# Patient Record
Sex: Female | Born: 1945 | Race: White | Hispanic: No | Smoking: Never smoker
Health system: Southern US, Community
[De-identification: ages and names within clinical notes are randomized; demographics above are authoritative.]

## PROBLEM LIST (undated history)

## (undated) DIAGNOSIS — J45909 Unspecified asthma, uncomplicated: Secondary | ICD-10-CM

## (undated) DIAGNOSIS — R1310 Dysphagia, unspecified: Secondary | ICD-10-CM

## (undated) DIAGNOSIS — M1990 Unspecified osteoarthritis, unspecified site: Secondary | ICD-10-CM

## (undated) DIAGNOSIS — J40 Bronchitis, not specified as acute or chronic: Secondary | ICD-10-CM

## (undated) DIAGNOSIS — H35379 Puckering of macula, unspecified eye: Secondary | ICD-10-CM

## (undated) DIAGNOSIS — I2510 Atherosclerotic heart disease of native coronary artery without angina pectoris: Secondary | ICD-10-CM

## (undated) DIAGNOSIS — N12 Tubulo-interstitial nephritis, not specified as acute or chronic: Secondary | ICD-10-CM

## (undated) DIAGNOSIS — I249 Acute ischemic heart disease, unspecified: Secondary | ICD-10-CM

## (undated) DIAGNOSIS — H409 Unspecified glaucoma: Secondary | ICD-10-CM

## (undated) DIAGNOSIS — M353 Polymyalgia rheumatica: Secondary | ICD-10-CM

## (undated) DIAGNOSIS — A0472 Enterocolitis due to Clostridium difficile, not specified as recurrent: Secondary | ICD-10-CM

## (undated) DIAGNOSIS — M316 Other giant cell arteritis: Secondary | ICD-10-CM

## (undated) DIAGNOSIS — G43909 Migraine, unspecified, not intractable, without status migrainosus: Secondary | ICD-10-CM

## (undated) HISTORY — PX: FINGER SURGERY: SHX640

## (undated) HISTORY — PX: FOOT SURGERY: SHX648

## (undated) HISTORY — PX: CHOLECYSTECTOMY: SHX55

## (undated) HISTORY — PX: APPENDECTOMY: SHX54

## (undated) HISTORY — PX: SKIN BIOPSY: SHX1

## (undated) HISTORY — PX: KNEE SURGERY: SHX244

## (undated) HISTORY — PX: EYE SURGERY: SHX253

---

## 1993-12-07 HISTORY — PX: CORONARY STENT PLACEMENT: SHX1402

## 2008-09-04 ENCOUNTER — Ambulatory Visit: Payer: Self-pay | Admitting: Cardiology

## 2008-09-05 ENCOUNTER — Inpatient Hospital Stay (HOSPITAL_COMMUNITY): Admission: AD | Admit: 2008-09-05 | Discharge: 2008-09-07 | Payer: Self-pay | Admitting: Cardiology

## 2008-09-05 ENCOUNTER — Ambulatory Visit: Payer: Self-pay | Admitting: Cardiovascular Disease

## 2008-09-13 ENCOUNTER — Inpatient Hospital Stay (HOSPITAL_COMMUNITY): Admission: EM | Admit: 2008-09-13 | Discharge: 2008-09-16 | Payer: Self-pay | Admitting: Emergency Medicine

## 2008-09-13 ENCOUNTER — Ambulatory Visit: Payer: Self-pay | Admitting: Cardiology

## 2008-09-14 ENCOUNTER — Encounter: Payer: Self-pay | Admitting: Cardiovascular Disease

## 2008-10-02 ENCOUNTER — Ambulatory Visit: Payer: Self-pay | Admitting: Cardiovascular Disease

## 2009-08-17 DIAGNOSIS — I10 Essential (primary) hypertension: Secondary | ICD-10-CM | POA: Insufficient documentation

## 2009-08-17 DIAGNOSIS — M1990 Unspecified osteoarthritis, unspecified site: Secondary | ICD-10-CM | POA: Insufficient documentation

## 2009-08-17 DIAGNOSIS — E782 Mixed hyperlipidemia: Secondary | ICD-10-CM

## 2009-08-17 DIAGNOSIS — J45909 Unspecified asthma, uncomplicated: Secondary | ICD-10-CM | POA: Insufficient documentation

## 2009-08-17 DIAGNOSIS — E119 Type 2 diabetes mellitus without complications: Secondary | ICD-10-CM

## 2010-08-07 HISTORY — PX: COLONOSCOPY: SHX174

## 2011-04-21 NOTE — Discharge Summary (Signed)
NAME:  Kelli Stewart, Kelli Stewart               ACCOUNT NO.:  [id number]   MEDICAL RECORD NO.:  [id number]          PATIENT TYPE:  INP   LOCATION:  4730                         FACILITY:  [facility]   PHYSICIAN:  Madolyn Frieze. Jens Som, MD, FACCDATE OF BIRTH:  01-27-1946   DATE OF ADMISSION:  09/13/2008  DATE OF DISCHARGE:  09/16/2008                               DISCHARGE SUMMARY   PROCEDURES:  1. VQ scan.  2. Two-view chest x-ray.  3. Two-D echocardiogram.   PRIMARY AND FINAL DISCHARGE DIAGNOSIS:  Acute coronary syndrome, medical  therapy for coronary artery disease recommended.   SECONDARY DIAGNOSES:  1. Coronary artery disease status post cardiac catheterization September 06, 2008 showing minimal in-stent restenosis of 20% in the left      anterior descending, obtuse marginal-2 20%, right coronary artery      40%, ejection fraction 65%.  2. Preserved left ventricular function with an ejection fraction of 65-      70% and no wall motion abnormalities, diastolic dysfunction seen      with no significant valvular abnormalities and a small pericardial      effusion, 3 mm anterior, and 3 mm posterior by echocardiogram      September 14, 2008.  3. Diabetes.  4. Status post drug-eluting stent to the left anterior descending in      2005 in [place], [place].  5. Hypertension.  6. Hyperlipidemia.  7. Osteoarthritis.  8. History of pulmonary embolus in 1981.  9. History of noncompliance with medications secondary to financial      issues.  10.History of asthma.  11.Peripheral vascular disease with supportive carotid disease.  12.Status post cholecystectomy and appendectomy.  13.Allergy or intolerance to IV CONTRAST.  AMBIEN, CELEBREX,      HYDROCHLOROTHIAZIDE, LASIX, MAXZIDE, MOTRIN, THEO-DUR, ENALAPRIL.   TIME OF DISCHARGE:  41 minutes.   HOSPITAL COURSE:  Kelli Stewart is a 65 year old female that came to the  hospital with chest pain and had some elevation in the troponin to 0.16.  She was cathed  and had nonobstructive disease.  She was discharged on  September 07, 2008.  On September 13, 2008 she had recurrent chest pain and  came back to the hospital where she was admitted for further evaluation  and treatment.   Her cardiac enzymes had some elevation with a peak CK-MB 63/4.8 and a  peak troponin I 0.34.  It was felt that the elevations in her enzymes  were possibly cardiac but with a recent catheterization showing  nonobstructive disease.  A repeat catheterization was not indicated.  There was concern for small vessel disease and concern for a PE so a VQ  scan was performed.  The VQ scan showed no pulmonary embolus.  Medical  therapy for coronary artery disease was then recommended and her Imdur  was increased and her beta blocker was increased as well.  She tolerated  both of these changes.  A hemoglobin A1c was 6.9.  A lipid profile  showed total cholesterol 179, triglycerides 198, HDL 46, LDL 93.  She is  to continue on the  Pravachol she was taking prior to admission.  Of  note, she had some orthostatic dizziness and her blood pressure was  slightly low.  She was hydrated and her symptoms improved.   By September 16, 2008, Kelli Stewart was ambulating without chest pain or  shortness of breath.  Her dizziness had improved.  She was evaluated by  Dr. Jens Som and considered stable for discharge with close outpatient  followup.   DISCHARGE INSTRUCTIONS:  1. Her activity level is to be increased gradually.  2. She is not to do any lifting for another week.  3. She is to stick to a low-fat diabetic diet.  4. She is to follow up with Dr. Clifton James in approximately 2 weeks in      our office will call.  5. She is to keep her appointment with Dr. Toni Amend in [place] this      Friday.   DISCHARGE MEDICATIONS:  1. Aspirin 325 mg daily.  2. Advair 250/50 b.i.d.  3. Neurontin 400 mg t.id.  4. Glucotrol 5 mg daily.  5. Sliding scale insulin p.r.n.  6. Combivent 2 puffs four times a day.   7. Imdur 60 mg daily.  8. Metoprolol 25 mg b.i.d.  9. Pravachol 40 mg daily.  10.Sublingual nitroglycerin p.r.n.  11.Ultram 50 mg t.i.d. p.r.n.      Theodore Demark, PA-C      Madolyn Frieze. Jens Som, MD, [facility]  Electronically Signed    RB/MEDQ  D:  09/16/2008  T:  09/16/2008  Job:  [id number]   cc:   Julio Alm, MD

## 2011-04-21 NOTE — Assessment & Plan Note (Signed)
[facility]                            CARDIOLOGY OFFICE NOTE   NAME:Juba, MEAGEN LIMONES                      MRN:          [id number]  DATE:10/02/2008                            DOB:          [birth year]    PRIMARY CARE PHYSICIAN:  Doreen Beam   HISTORY OF PRESENT ILLNESS:  Ms. Wrightson is a pleasant 65 year old  Caucasian female with a past medical history significant for  hypertension, hyperlipidemia, diabetes mellitus, coronary artery  disease, and peripheral vascular disease who presents today to establish  care following a recent discharge from the [facility].  The  patient was initially admitted to [facility] on September 05, 2008, with complaints of chest pain.  Her troponins were mildly elevated  to 0.16.  Diagnostic left heart catheterization was performed on September 06, 2008, and showed a patent stent in the LAD, as well as mild  nonobstructive disease throughout the other vessels.  Her left  ventricular function was noted to be normal at that time.  She was  discharged to home and was subsequently readmitted to the hospital on  September 13, 2008, with complaints of recurrent chest pain.  At that time,  a V/Q scan was performed and showed very low probability for pulmonary  embolism.  An echocardiogram was performed and showed an ejection  fraction of 65-70% with no wall motion abnormalities.  There was a small  pericardial effusion noted on the echocardiogram.  The patient once  again had a mild elevation of her troponin at 0.34.  It was not felt  that she had any significant obstructive coronary artery disease since  her catheterization had just been performed on September 06, 2008.  It was  felt that most likely she had pericarditis; however, she had no active  chest pain when she was discharged from the hospital.  The patient went  home and then was readmitted to [facility] on September 21, 2008,  after she had an  episode of chest discomfort while in the waiting room  in her primary care physician's office.  She was admitted and ruled out  for myocardial infarction.  She was then treated for her pericarditis  and was started on colchicine.  She tells me that she has had no  recurrence of her pain in the last 10 days.  She comes in today to  establish cardiology care.   The patient tells me today that she has had no recurrence of her chest  pain since her discharge from the hospital in [place] 10 days ago.  She  does tell me that she has had some weakness and fatigue seem to have a  total lack of energy.  She is to exercise 5 days per week, but has not  been exercising at all.  She also notes that she had several weeks of  diarrhea last month and contribute some of her weakness to starting  after this episode.  She denies any dizziness, near syncope, syncope,  orthopnea, PND, or lower extremity edema.   PAST MEDICAL HISTORY:  1. Hypertension.  2.  Hyperlipidemia.  3. Diabetes mellitus.  4. Coronary artery disease status post placement of a drug-eluting      stent in the left anterior descending coronary artery in 2005 with      followup catheterization done on September 06, 2008, showing mild      restenosis inside the stented segment.  5. History of pulmonary embolism in 1981.  6. Asthma.  7. Peripheral vascular disease with known carotid artery disease.  8. Osteoarthritis.   PAST SURGICAL HISTORY:  1. Cholecystectomy.  2. Appendectomy.  3. Finger surgery.  4. Bilateral knee scopes.   ALLERGIES:  AMBIEN, MAXZIDE, MOTRIN, THEO-DUR, ENALAPRIL, BETADINE,  HYDROCHLOROTHIAZIDE, LASIX, CELEBREX, NONSTEROIDAL ANTI-INFLAMMATORY  DRUGS.   CURRENT MEDICATIONS:  1. Aspirin 325 mg once daily.  2. Advair 250/50 mg twice daily.  3. Neurontin 400 mg three times daily.  4. Glucotrol 5 mg once daily.  5. Imdur 60 mg once daily.  6. Metoprolol 25 mg twice daily.  7. Pravachol 40 mg once daily.  8.  Metformin 500 mg twice daily.  9. Combivent 2 puffs twice daily.  10.Lamictal 25 mg twice daily.  11.Humulin regular insulin as directed.  12.Percocet b.i.d. for migraines.  13.Colchicine 0.6 mg twice daily.   SOCIAL HISTORY:  The patient does not use tobacco, alcohol, or illicit  drugs.  She was formerly employed as a Engineer, civil (consulting).   FAMILY HISTORY:  Positive for malignancy, heart disease, and  fibromyalgia.   REVIEW OF SYSTEMS:  As stated in the history of present illness and is  otherwise negative.   PHYSICAL EXAMINATION:  VITALS:  Blood pressure 104/71, pulse 80 and  regular, respirations 12 and nonlabored.  GENERAL:  She is an middle-aged Caucasian female in no acute distress.  She is alert and oriented x3.  PSYCHIATRIC:  Mood and affect are appropriate.  NEUROLOGIC:  No focal neurological deficits.  SKIN:  Warm and dry.  HEENT:  Oropharynx clear.  Mucous membranes are moist.  NECK:  No JVD.  No carotid bruits noted.  No thyromegaly.  No  lymphadenopathy.  LUNGS:  Clear to auscultation bilaterally without wheezes, rhonchi, or  crackles noted.  CARDIOVASCULAR:  Regular rate and rhythm without murmurs, gallops, or  rubs noted.  ABDOMEN:  Soft, nontender, nondistended.  Bowel sounds are present.  EXTREMITIES:  No evidence of edema.  Pulses are 2+ in the bilateral  dorsalis pedis and posterior tibial arteries.  Pulses are 2+ in the  bilateral radial arteries.  MUSCULOSKELETAL:  Strength and motor tone are normal.   DIAGNOSTIC STUDIES:  1. Surface echocardiogram performed on September 14, 2008, shows normal      left ventricular size with normal left ventricular function.      Ejection fraction estimated at 65-70%.  There was a small      pericardial effusion 3 mm x 3 mm in dimension.  There were no      significant valvular abnormalities noted.  2. Cardiac catheterization performed on September 06, 2008, showed no      evidence of left main coronary artery disease.  The stented  segment      in the proximal LAD showed minimal in-stent restenosis of 20%.      There was plaque disease noted in the mid LAD.  There were two      moderate-sized diagonal branches that were free of disease.  The      circumflex artery had plaque disease in the proximal portion.  The      second  obtuse marginal had a 20% ostial lesion.  There was a      moderate size ramus intermedius branch that was free of disease.      Right coronary artery was a small and nondominant artery that had a      40% proximal stenosis.  Ejection fraction was estimated at 65% by      left ventricular angiogram.  3. A 12-lead electrocardiogram obtained in our office today shows      normal sinus rhythm with a ventricular rate of 80 beats per minute      and no ischemic changes.   ASSESSMENT AND PLAN:  This is a pleasant 65 year old Caucasian female  with a past medical history of coronary artery disease with placement of  a PROMUS drug-eluting stent in her proximal left anterior descending  (coronary artery) in 2005, hypertension, hyperlipidemia, and diabetes  mellitus who has had several recent hospitalizations for chest pain that  are most likely secondary to pericarditis.  There was no evidence of  obstructive coronary artery disease in her most recent heart  catheterization on September 06, 2008.  I think that she is on appropriate  medical therapy currently.  I agree with continuing her on the  colchicine for treatment of her possible pericarditis.  I will also  continue her aspirin, beta-blocker, and statin therapy.  I would like to  see her back in this office in 6 months for followup.  I would like to  perform follow up carotid Doppler studies since she tells me today she  has had mild-to-moderate blockages noted in the carotid arteries in the  past.     Verne Carrow, MD  Electronically Signed    CM/MedQ  DD: 10/02/2008  DT: 10/02/2008  Job #: [id number]   cc:   Doreen Beam

## 2011-04-21 NOTE — H&P (Signed)
Kelli Stewart, Kelli Stewart               ACCOUNT NO.:  [id number]   MEDICAL RECORD NO.:  [id number]          PATIENT TYPE:  INP   LOCATION:  2916                         FACILITY:  [facility]   PHYSICIAN:  Rollene Rotunda, MD, FACCDATE OF BIRTH:  August 17, 1946   DATE OF ADMISSION:  09/13/2008  DATE OF DISCHARGE:                              HISTORY & PHYSICAL   PRIMARY CARE Nikisha Fleece:  Dr. Doreen Beam in [place].   PATIENT'S PROFILE:  A 65 year old Caucasian female with history of CAD,  status post recent catheterization revealing nonobstructive disease  presents with recurrent chest pain and dyspnea.   PROBLEMS:  1. Chest pain and dyspnea.  2. CAD.      a.     Status post drug-eluting stent to the LAD in 2005 in       [place], [place].      b.     September 06, 2008, cardiac catheterization left main normal.       LAD 20% in-stent restenosis.  Left circumflex minimal proximal       plaque.  OM-2 20%.  Ramus intermedius normal.  RCA 40% proximal.       EF 65%.  3. Hypertension x2 years.  4. Hyperlipidemia x4 years.  5. Diabetes mellitus x5 years.  6. Osteoarthritis.  7. History of noncompliance.  8. History of pulmonary embolism in 1981.  9. Asthma.  10.PVD with reported carotid disease.  11.Status post cholecystectomy and appendectomy.   HISTORY OF PRESENT ILLNESS:  A 65 year old Caucasian female with history  of CAD status post drug-eluting stent to the LAD in 2005.  She was  recently admitted to [facility] secondary to chest pain and dyspnea with  catheterization on September 06, 2008, showing patent LAD stent.  Otherwise, nonobstructive disease and normal LV function.  Following  discharge, she did well, but continued no dyspnea on exertion.  Associated brief spell of left-sided chest discomfort lasting about 15  seconds and resolving spontaneously.  Today while showering, she had  sudden onset of dizziness with chest pain and shortness of breath times  approximately 15 minutes resolving  with rest.  Her chest discomfort and  dyspnea recurred 3 additional times today, and she asked her daughter to  drive her from her home in [place] to [facility] for evaluation.  Currently, she is pain free, but is tachycardic with rates up to the  120s.  ECG, otherwise, shows no acute changes.  She has mild elevation  of troponin at 0.08.   ALLERGIES:  IV CONTRAST, AMBIEN, CELEBREX, HYDROCHLOROTHIAZIDE, LASIX,  MAXZIDE, MOTRIN, THEO-DUR, AND ENALAPRIL.   HOME MEDICATIONS:  1. Advair Diskus 1 puff b.i.d.  2. Aspirin 325 mg daily.  3. Combivent inhaler q.i.d.  4. Glipizide 5 mg daily.  5. Lopressor 12.5 mg b.i.d.  6. Metformin 500 mg b.i.d.  7. Neurontin 400 mg t.i.d.  8. Pravachol 40 mg daily.  9. Imdur 30 mg daily.   FAMILY HISTORY:  Mother died of renal failure at age 29 following IV  contrast.  Father died of COPD at 64.  She had a brother who died at 3  of lung cancer, and a sister who is alive at age 46 with fibromyalgia  and hyperlipidemia.   SOCIAL HISTORY:  She lives in [place], [place] with her daughter.  She  is disabled.  She denies tobacco, alcohol, or drug use.  She is not  routinely exercising.   REVIEW OF SYSTEMS:  Positive for chest pain and dyspnea as well as  dizziness, outlined in the HPI.  Otherwise all systems reviewed and  negative.   PHYSICAL EXAMINATION:  VITAL SIGNS:  Temperature 98.1, heart rate 123,  respirations 22, blood pressure 149/84, pulse ox 95% on room air.  GENERAL:  Pleasant [place] female in no acute distress.  Awake and  oriented x3.  HEENT:  Normal.  Nares grossly intact.  Nonfocal.  SKIN:  Warm and dry without lesions or masses.  NECK:  No bruits or JVD.  LUNGS:  Respirations regular, unlabored, clear to auscultation.  CARDIAC:  Regular, tachycardic, S1 and S2.  No S3, S4, or murmurs.  ABDOMEN:  Round, soft, nontender, and nondistended.  Bowel sounds  present.  EXTREMITIES:  Warm, dry, pink.  No clubbing, cyanosis, or edema.   Dorsalis pedis, posterior tibial pulses 2+ bilaterally.   Chest x-ray shows mild central venous pulmonary congestion.  EKG shows  sinus tach at a rate of 122.  Normal axis.  No acute ST-T changes.  Hemoglobin 14.2, hematocrit 41.7, WBC 8.3, platelets 193.  Glucose 238,  CK-MB 2.8, troponin I 0.08.   ASSESSMENT AND PLAN:  1. Chest pain and dyspnea, status post recent catheterization on      September 06, 2008, showing nonobstructive disease.  She has continued      to have dyspnea and now has had recurrent chest pain.  She is      tachycardic, but otherwise stable.  Clinical presentation      concerning for pulmonary embolism.  We will obtain a V/Q scan      tonight (V/Q instead of the CT because of dye allergy) and we will      also initiate Lovenox.  Troponin I is mildly elevated at 0.08.  We      will continue to cycle cardiac markers.  Doubt cardiac etiology in      light of recent catheterization showing nonobstructive disease.      Continue aspirin, statin, beta-blocker, and nitrate.  2. Hypertension.  Blood pressure is up currently.  We will follow and      adjustment if necessary.  3. Hyperlipidemia.  Continue statin therapy.  4. Diabetes mellitus.  I will hold her metformin in case she does      require dye at some point during this admission.  We will add      sliding scale insulin.  5. Asthma.  She is not currently wheezing.  Continue Advair and      Combivent.      Nicolasa Ducking, ANP      Rollene Rotunda, MD, [facility]  Electronically Signed    CB/MEDQ  D:  09/13/2008  T:  09/14/2008  Job:  [id number]

## 2011-04-21 NOTE — Cardiovascular Report (Signed)
NAME:  Kelli Stewart, Kelli Stewart               ACCOUNT NO.:  [id number]   MEDICAL RECORD NO.:  [id number]          PATIENT TYPE:  INP   LOCATION:  3705                         FACILITY:  [facility]   PHYSICIAN:  Verne Carrow, MDDATE OF BIRTH:  09-09-1946   DATE OF PROCEDURE:  09/06/2008  DATE OF DISCHARGE:                            CARDIAC CATHETERIZATION   INDICATIONS:  Chest pain in a patient with known coronary artery disease  as well as hypertension, hyperlipidemia, diabetes mellitus, who has not  been taking her medication secondary to financial issues.   OPERATOR:  Verne Carrow, MD   PROCEDURES PERFORMED:  1. Left heart catheterization  2. Selective coronary angiography.  3. Left ventricular angiogram.  4. Placement of an Angio-Seal femoral artery closure device.   PROCEDURE IN DETAIL:  The patient was brought to the Heart  Catheterization Laboratory after signing informed consent for the  procedure.  The right groin was prepped and draped in sterile fashion.  A 6-French sheath was inserted into the right femoral artery.  The left  coronary system was injected with a standard JL-4 diagnostic catheter.  I was unable to engage the JR-4 into the right coronary artery.  A no  torque right catheter was used to selectively inject the small  nondominant right coronary artery.  A 6-French pigtail catheter was then  used to cross the aortic valve into the left ventricle.  The left  ventricular angiogram was performed at this time.  At this point, the  pigtail catheter was pulled back across the aortic valve with no  significant pressure gradient measured.  An Angio-Seal femoral artery  closure device was placed in the right femoral artery for hemostasis.  The patient tolerated the procedure well and was taken to the holding  area in stable condition.   ANGIOGRAPHIC FINDINGS:  1. The left main coronary artery bifurcated into the circumflex and      the LAD.  There was no disease  noted.  2. The left anterior descending artery was a large vessel that goes to      the apex.  The stented segment of the proximal LAD shows minimal in-      stent restenosis of approximately 20%.  There is plaque disease      noted in the mid LAD.  There are 2 moderate-sized diagonal branches      that are free of disease.  3. Circumflex artery has plaque noted in the proximal portion.  There      is a small first obtuse marginal and a larger second obtuse      marginal.  There is 20% ostial lesion in the second obtuse      marginal.  There is a moderate size ramus intermedius branch that      is free of disease.  4. The right coronary artery is a small nondominant artery that has a      40% proximal stenosis.  5. Left ventricular angiogram demonstrated normal systolic function      with no wall motion abnormalities.  The ejection fraction was      estimated at  65%.   HEMODYNAMIC FINDINGS:  Left ventricular pressure 112/6, end-diastolic  pressure 9, central aortic pressure 112/60.   IMPRESSION:  1. Stable single-vessel coronary artery disease.  2. Normal systolic function.  3. Medical noncompliance secondary to financial reasons.   RECOMMENDATIONS:  I will continue medical management, which will include  an aspirin, a statin, and a beta blocker in this patient.  I will also  add Imdur 30 mg once daily.  The patient should be restarted on all of  her diabetic medications as well.  I do not see any indication at this  time for the patient to be restarted on Plavix, as she has stated that  she cannot afford this medication.  Her stent was placed in the LAD 4  years ago.      Verne Carrow, MD  Electronically Signed     CM/MEDQ  D:  09/06/2008  T:  09/06/2008  Job:  [id number]

## 2011-04-21 NOTE — Discharge Summary (Signed)
NAMEJARITA, Kelli Stewart               ACCOUNT NO.:  [id number]   MEDICAL RECORD NO.:  [id number]          PATIENT TYPE:  INP   LOCATION:  3705                         FACILITY:  [facility]   PHYSICIAN:  Verne Carrow, MDDATE OF BIRTH:  January 22, 1946   DATE OF ADMISSION:  09/05/2008  DATE OF DISCHARGE:  09/07/2008                               DISCHARGE SUMMARY   PRIMARY CARDIOLOGIST:  Verne Carrow, MD.   PRIMARY CARE PHYSICIAN:  Family practice, Dr. Toni Amend in [place].   PROCEDURES PERFORMED DURING HOSPITALIZATION:  1. Cardiac catheterization completed by Dr. Verne Carrow.      a.     Stable single-vessel coronary artery disease with normal       systolic function.   FINAL DISCHARGE DIAGNOSES:  1. Coronary artery disease.      a.     Status post drug-eluting stent to the left anterior       descending completed in 2005, in [place].  2. Diabetes.  3. Hypertension.  4. Hypercholesterolemia.  5. Arthritis.  6. Medical noncompliance secondary to cost issues.   HOSPITAL COURSE:  This is a 65 year old Caucasian female who was  transferred from [facility] secondary to recurrent chest  discomfort.  The patient does have an allergy to contrast dye and was  admitted for cardiac catheterization secondary to unstable angina.  The  patient's troponins were found be positive at 0.16, 0.06, and 0.02  respectively.  As a result of this, the patient was admitted and  underwent cardiac catheterization per Dr. Verne Carrow on  September 05, 2008, with results discussed above.  Please see Dr.  Gibson Ramp thorough cardiac catheterization note for more details.  The  patient admitted to medical noncompliance secondary to financial  reasons.  She was seen by case manager and was told that she did have  insurance and would not be eligible for any drug assistance program.  We  did change her medications to allow her to receive her medications from  [facility] four dollar  list.  Of note, the patient had been on Plavix in  the past and this was discontinued and was started on Imdur and aspirin.  The patient is without complaints of chest discomfort.  She did have  some hallucinations on Ambien overnight.  Otherwise, she had no further  chest pain or discomfort during hospitalization.  The patient was  anxious to return home after being seen by Dr. Clifton James this a.m. and  the patient was informed on medication changes and drug list from [facility], which we are providing for her.   DISCHARGE LABORATORY DATA:  Sodium 143, potassium 3.8, chloride 113, CO2  of 24, BUN 12, creatinine 0.78, and glucose 98.  Hemoglobin 13.5,  hematocrit 39.4, [place] blood cells 10.0, platelets 159, and hemoglobin  A1c 6.6.  Blood pressure 111/72, heart rate 90, respirations 80,  temperature 97.0, and O2 sat 93% on room air.   DISCHARGE MEDICATIONS:  1. Pravachol 40 mg daily.  2. Isosorbide 30 mg daily.  3. Lopressor 25 mg 1/2 tablet twice a day.  4. Aspirin 325 mg daily.  5.  Glucophage 500 mg twice a day to start on September 08, 2008.  6. Glipizide 5 mg daily.  7. Gabapentin 400 mg 3 times a day.   ALLERGIES:  ENALAPRIL, FUROSEMIDE, MAXZIDE, CELEBREX and PREDNISONE.   FOLLOW UP PLANS AND APPOINTMENT:  1. The patient is to follow up with Dr. Verne Carrow on      December 04, 2008, at 2:00 p.m.  2. The patient is to follow with primary care physician for continued      medical management of diabetes.  3. The patient has been given post cardiac catheterization      instructions with particular emphasis on the right groin site for      evidence of bleeding, hematoma, or signs of infection.  Time spent      with the patient to include physician time 40 minutes.      Bettey Mare. Lyman Bishop, NP      Verne Carrow, MD  Electronically Signed    KML/MEDQ  D:  09/07/2008  T:  09/07/2008  Job:  [id number]   cc:   Doreen Beam

## 2011-09-07 LAB — GLUCOSE, CAPILLARY
Glucose-Capillary: 107 — ABNORMAL HIGH
Glucose-Capillary: 149 — ABNORMAL HIGH

## 2011-09-07 LAB — HEPARIN LEVEL (UNFRACTIONATED): Heparin Unfractionated: >2 — ABNORMAL HIGH

## 2011-09-08 LAB — GLUCOSE, CAPILLARY
Glucose-Capillary: 126 — ABNORMAL HIGH
Glucose-Capillary: 133 — ABNORMAL HIGH
Glucose-Capillary: 150 — ABNORMAL HIGH
Glucose-Capillary: 157 — ABNORMAL HIGH
Glucose-Capillary: 189 — ABNORMAL HIGH
Glucose-Capillary: 256 — ABNORMAL HIGH
Glucose-Capillary: 261 — ABNORMAL HIGH
Glucose-Capillary: 94

## 2011-09-08 LAB — BASIC METABOLIC PANEL
BUN: 11
CO2: 24
CO2: 29
Calcium: 9.2
Chloride: 113 — ABNORMAL HIGH
Creatinine, Ser: 0.78
GFR calc Af Amer: >60
GFR calc Af Amer: >60
GFR calc non Af Amer: >60
GFR calc non Af Amer: >60
GFR calc non Af Amer: >60
Potassium: 3.8
Sodium: 140
Sodium: 140
Sodium: 143

## 2011-09-08 LAB — DIFFERENTIAL
Eosinophils Absolute: 0.2
Lymphs Abs: 2.6
Monocytes Absolute: 0.7
Neutro Abs: 4.6
Neutrophils Relative %: 56

## 2011-09-08 LAB — COMPREHENSIVE METABOLIC PANEL
Alkaline Phosphatase: 57
BUN: 11
BUN: 11
CO2: 25
Calcium: 9.5
Creatinine, Ser: 0.59
Creatinine, Ser: 0.66
GFR calc Af Amer: >60
GFR calc non Af Amer: >60
Glucose, Bld: 96
Sodium: 142
Total Bilirubin: 0.5
Total Protein: 6.2

## 2011-09-08 LAB — CBC
HCT: 39.4
HCT: 42.5
Hemoglobin: 13.1
Hemoglobin: 13.5
Hemoglobin: 14.3
MCHC: 33.7
MCHC: 34.2
MCHC: 34.4
MCV: 92.4
MCV: 93
MCV: 93.6
MCV: 94.4
Platelets: 168
Platelets: 193
RBC: 3.84 — ABNORMAL LOW
RBC: 4.14
RDW: 12.8
RDW: 12.9
WBC: 10
WBC: 6.8

## 2011-09-08 LAB — POCT CARDIAC MARKERS
Myoglobin, poc: 64.1
Troponin i, poc: 0.08 — ABNORMAL HIGH

## 2011-09-08 LAB — PROTIME-INR
INR: 0.9
INR: 1.1
Prothrombin Time: 12.6
Prothrombin Time: 12.8
Prothrombin Time: 14

## 2011-09-08 LAB — HEPARIN LEVEL (UNFRACTIONATED): Heparin Unfractionated: 1.36 — ABNORMAL HIGH

## 2011-09-08 LAB — CARDIAC PANEL(CRET KIN+CKTOT+MB+TROPI)
CK, MB: 4.6 — ABNORMAL HIGH
CK, MB: 4.8 — ABNORMAL HIGH
Total CK: 50

## 2011-09-08 LAB — TSH: TSH: 3.531

## 2011-09-08 LAB — CK TOTAL AND CKMB (NOT AT ARMC)
CK, MB: 2.3
Relative Index: INVALID
Total CK: 36
Total CK: 55

## 2011-09-08 LAB — D-DIMER, QUANTITATIVE: D-Dimer, Quant: 0.39

## 2011-09-08 LAB — APTT: aPTT: 25

## 2011-09-08 LAB — HEMOGLOBIN A1C: Hgb A1c MFr Bld: 6.9 — ABNORMAL HIGH

## 2011-09-08 LAB — LIPID PANEL
HDL: 46
Total CHOL/HDL Ratio: 3.9
Triglycerides: 198 — ABNORMAL HIGH

## 2012-05-24 ENCOUNTER — Emergency Department (HOSPITAL_COMMUNITY)
Admission: EM | Admit: 2012-05-24 | Discharge: 2012-05-24 | Disposition: A | Discharge status: Home / Self Care | Payer: Medicare Other | Attending: Emergency Medicine | Admitting: Emergency Medicine

## 2012-05-24 ENCOUNTER — Encounter (HOSPITAL_COMMUNITY): Payer: Self-pay

## 2012-05-24 DIAGNOSIS — L0390 Cellulitis, unspecified: Secondary | ICD-10-CM

## 2012-05-24 DIAGNOSIS — L03317 Cellulitis of buttock: Secondary | ICD-10-CM | POA: Insufficient documentation

## 2012-05-24 DIAGNOSIS — E119 Type 2 diabetes mellitus without complications: Secondary | ICD-10-CM | POA: Insufficient documentation

## 2012-05-24 DIAGNOSIS — L0231 Cutaneous abscess of buttock: Secondary | ICD-10-CM | POA: Insufficient documentation

## 2012-05-24 HISTORY — DX: Unspecified osteoarthritis, unspecified site: M19.90

## 2012-05-24 HISTORY — DX: Unspecified asthma, uncomplicated: J45.909

## 2012-05-24 HISTORY — DX: Other giant cell arteritis: M31.6

## 2012-05-24 HISTORY — DX: Migraine, unspecified, not intractable, without status migrainosus: G43.909

## 2012-05-24 HISTORY — DX: Tubulo-interstitial nephritis, not specified as acute or chronic: N12

## 2012-05-24 HISTORY — DX: Puckering of macula, unspecified eye: H35.379

## 2012-05-24 HISTORY — DX: Acute ischemic heart disease, unspecified: I24.9

## 2012-05-24 HISTORY — DX: Unspecified glaucoma: H40.9

## 2012-05-24 HISTORY — DX: Enterocolitis due to Clostridium difficile, not specified as recurrent: A04.72

## 2012-05-24 HISTORY — DX: Polymyalgia rheumatica: M35.3

## 2012-05-24 MED ORDER — METRONIDAZOLE 500 MG PO TABS: 500.0000 mg | ORAL_TABLET | Freq: Two times a day (BID) | ORAL | Status: DC

## 2012-05-24 MED ORDER — HYDROCODONE-ACETAMINOPHEN 5-325 MG PO TABS: 1.0000 | ORAL_TABLET | ORAL | Status: DC | PRN

## 2012-05-24 MED ORDER — DOXYCYCLINE HYCLATE 100 MG PO CAPS: 100.0000 mg | ORAL_CAPSULE | Freq: Two times a day (BID) | ORAL | Status: DC

## 2012-05-24 MED ORDER — LIDOCAINE HCL (PF) 1 % IJ SOLN
5.0000 mL | Freq: Once | INTRAMUSCULAR | Status: AC
  Administered 2012-05-24: 5 mL via INTRADERMAL
  Filled 2012-05-24: qty 5

## 2012-05-24 NOTE — Discharge Instructions (Signed)
Abscess An abscess (boil or furuncle) is an infected area that contains a collection of pus.  SYMPTOMS Signs and symptoms of an abscess include pain, tenderness, redness, or hardness. You may feel a moveable soft area under your skin. An abscess can occur anywhere in the body.  TREATMENT  A surgical cut (incision) may be made over your abscess to drain the pus. Gauze may be packed into the space or a drain may be looped through the abscess cavity (pocket). This provides a drain that will allow the cavity to heal from the inside outwards. The abscess may be painful for a few days, but should feel much better if it was drained.  Your abscess, if seen early, may not have localized and may not have been drained. If not, another appointment may be required if it does not get better on its own or with medications. HOME CARE INSTRUCTIONS   Only take over-the-counter or prescription medicines for pain, discomfort, or fever as directed by your caregiver.   Take your antibiotics as directed if they were prescribed. Finish them even if you start to feel better.   Keep the skin and clothes clean around your abscess.   If the abscess was drained, you will need to use gauze dressing to collect any draining pus. Dressings will typically need to be changed 3 or more times a day.   The infection may spread by skin contact with others. Avoid skin contact as much as possible.   Practice good hygiene. This includes regular hand washing, cover any draining skin lesions, and do not share personal care items.   If you participate in sports, do not share athletic equipment, towels, whirlpools, or personal care items. Shower after every practice or tournament.   If a draining area cannot be adequately covered:   Do not participate in sports.   Children should not participate in day care until the wound has healed or drainage stops.   If your caregiver has given you a follow-up appointment, it is very important  to keep that appointment. Not keeping the appointment could result in a much worse infection, chronic or permanent injury, pain, and disability. If there is any problem keeping the appointment, you must call back to this facility for assistance.  SEEK MEDICAL CARE IF:   You develop increased pain, swelling, redness, drainage, or bleeding in the wound site.   You develop signs of generalized infection including muscle aches, chills, fever, or a general ill feeling.   You have an oral temperature above 102 F (38.9 C).  MAKE SURE YOU:   Understand these instructions.   Will watch your condition.   Will get help right away if you are not doing well or get worse.  Document Released: 09/02/2005 Document Revised: 11/12/2011 Document Reviewed: 06/26/2008 [facility] Patient Information 2012 [place].Cellulitis Cellulitis is an infection of the skin and the tissue beneath it. The area is typically red and tender. It is caused by germs (bacteria) (usually staph or strep) that enter the body through cuts or sores. Cellulitis most commonly occurs in the arms or lower legs.  HOME CARE INSTRUCTIONS   If you are given a prescription for medications which kill germs (antibiotics), take as directed until finished.   If the infection is on the arm or leg, keep the limb elevated as able.   Use a warm cloth several times per day to relieve pain and encourage healing.   See your caregiver for recheck of the infected site as  pain and encourage healing.   See your caregiver for recheck of the infected site as directed if problems arise.   Only take over-the-counter or prescription medicines for pain, discomfort, or fever as directed by your caregiver.  SEEK MEDICAL CARE IF:    The area of redness (inflammation) is spreading, there are red streaks coming from the infected site, or if a part of the infection begins to turn dark in color.   The joint or bone underneath the infected skin becomes painful after the skin has healed.   The infection returns in the same or another area after it seems to have  gone away.   A boil or bump swells up. This may be an abscess.   New, unexplained problems such as pain or fever develop.  SEEK IMMEDIATE MEDICAL CARE IF:    You have a fever.   You or your child feels drowsy or lethargic.   There is vomiting, diarrhea, or lasting discomfort or feeling ill (malaise) with muscle aches and pains.  MAKE SURE YOU:    Understand these instructions.   Will watch your condition.   Will get help right away if you are not doing well or get worse.  Document Released: 09/02/2005 Document Revised: 11/12/2011 Document Reviewed: 07/11/2008  [employer] Patient Information 2012 [employer].

## 2012-05-24 NOTE — ED Notes (Signed)
Pt reports ?spider bite to left buttock area, for 2 days, area getting more swollen and painful to her.  H/o abcess to perineum area. Stated she wanted to take care of before it got worse.

## 2012-05-24 NOTE — ED Notes (Signed)
Pt has abscess to left buttock x 3 days.

## 2012-05-24 NOTE — ED Notes (Signed)
Pa to see pt

## 2012-05-25 ENCOUNTER — Encounter (HOSPITAL_COMMUNITY): Payer: Self-pay | Admitting: Emergency Medicine

## 2012-05-25 DIAGNOSIS — A0472 Enterocolitis due to Clostridium difficile, not specified as recurrent: Secondary | ICD-10-CM | POA: Insufficient documentation

## 2012-05-25 NOTE — ED Provider Notes (Signed)
History     CSN: [id number]  Arrival date & time 05/24/12  1204   First MD Initiated Contact with Patient 05/24/12 1227      Chief Complaint  Patient presents with  . Abscess    (Consider location/radiation/quality/duration/timing/severity/associated sxs/prior treatment) Patient is a 66 y.o. female presenting with abscess. The history is provided by the patient.  Abscess  This is a new problem. Episode onset: 2 days ago. The onset was gradual. The problem has been gradually worsening. The abscess is present on the left buttock. The problem is moderate. The abscess is characterized by painfulness and swelling. The patient was exposed to spider bites (She did find a spider in her bed several nights ago.  However she also has a history of prior abscess, most recently one year ago in her left groin which resolved without intervention.). Pertinent negatives include no fever and no vomiting. Associated symptoms comments: She has had no nausea or emesis, no fever and no rash..    Past Medical History  Diagnosis Date  . PMR (polymyalgia rheumatica)   . Giant cell arteritis   . Acute coronary syndrome   . Diabetes mellitus   . Glaucoma   . Pyelonephritis   . Asthma   . Hypersomnia   . Dyspnea   . Chest pain   . Osteoarthritis   . Migraine   . Macular pucker     Past Surgical History  Procedure Date  . Knee surgery   . Cholecystectomy   . Appendectomy   . Finger surgery   . Foot surgery   . Coronary stent placement   . Skin biopsy     No family history on file.  History  Substance Use Topics  . Smoking status: Never Smoker   . Smokeless tobacco: Not on file  . Alcohol Use: No    OB History    Grav Para Term Preterm Abortions TAB SAB Ect Mult Living                  Review of Systems  Constitutional: Negative for fever and chills.  HENT: Negative for facial swelling.   Respiratory: Negative for shortness of breath and wheezing.   Gastrointestinal: Negative for  vomiting.  Skin: Positive for wound.  Neurological: Negative for numbness.    Allergies  Ambien; Betadine; Carisoprodol; Celebrex; Enalapril; Hctz; Iodine; Lasix; Maxidex; Motrin; and Theophyllines  Home Medications   Current Outpatient Rx  Name Route Sig Dispense Refill  . DOXYCYCLINE HYCLATE 100 MG PO CAPS Oral Take 1 capsule (100 mg total) by mouth 2 (two) times daily. 20 capsule 0  . HYDROCODONE-ACETAMINOPHEN 5-325 MG PO TABS Oral Take 1 tablet by mouth every 4 (four) hours as needed for pain. 20 tablet 0  . METRONIDAZOLE 500 MG PO TABS Oral Take 1 tablet (500 mg total) by mouth 2 (two) times daily. 14 tablet 0    BP 138/69  Pulse 109  Temp 98 F (36.7 C) (Oral)  Resp 18  Ht 5\' 7"  (1.702 m)  Wt 196 lb (88.905 kg)  BMI 30.70 kg/m2  SpO2 96%  Physical Exam  Constitutional: She is oriented to person, place, and time. She appears well-developed and well-nourished.  HENT:  Head: Normocephalic.  Cardiovascular: Normal rate.   Pulmonary/Chest: Effort normal.  Musculoskeletal: Normal range of motion.  Neurological: She is alert and oriented to person, place, and time. No sensory deficit.  Skin: There is erythema.       2 cm area of  induration on the left lateral buttock with 6 cm surrounding nontender erythema.  There is no fluctuance at the abscess site and no drainage, however there does appear to be a forming central punctum.    ED Course  Procedures (including critical care time)  Labs Reviewed - No data to display No results found.   1. Abscess of buttock, left   2. Cellulitis     Bedside ultrasound was utilized to assess for possible pocket and there was a tiny area of probable pus pocket present.  Discussed opening the abscess versus warm soaks and antibiotics,  patient has chosen I&D.    INCISION AND DRAINAGE Performed by: Burgess Amor Consent: Verbal consent obtained. Risks and benefits: risks, benefits and alternatives were discussed Type:  abscess  Body area: Left buttock  Anesthesia: local infiltration  Local anesthetic: lidocaine 1 % without epinephrine  Anesthetic total: 4 ml  Complexity: complex Blunt dissection to break up loculations  Drainage: purulent  Drainage amount: Small   Packing material: 1/4 in iodoform gauze  Patient tolerance: Patient tolerated the procedure well with no immediate complications.     MDM  Patient placed on doxycycline and also prescribed hydrocodone for pain relief.  Caution given regarding sedation.  Patient also states that with last episode of skin infection she was prescribed Flagyl in addition to doxycycline do to severe C. difficile infection previously.  She will be prescribed Flagyl as well, caution given regarding contacting her physician or returning here for recheck if she develops diarrhea at any point during this treatment.  Additionally, she was instructed to either see her primary Dr. or return here in 3 days to have her packing removed and for reassessment of this infection site and patient is agreeable to this.       Burgess Amor, [place] 05/25/12 1453

## 2012-05-27 ENCOUNTER — Encounter (HOSPITAL_COMMUNITY): Payer: Self-pay

## 2012-05-27 ENCOUNTER — Emergency Department (HOSPITAL_COMMUNITY)
Admission: EM | Admit: 2012-05-27 | Discharge: 2012-05-27 | Disposition: A | Discharge status: Home / Self Care | Payer: Medicare Other | Attending: Emergency Medicine | Admitting: Emergency Medicine

## 2012-05-27 DIAGNOSIS — L03317 Cellulitis of buttock: Secondary | ICD-10-CM | POA: Insufficient documentation

## 2012-05-27 DIAGNOSIS — L0231 Cutaneous abscess of buttock: Secondary | ICD-10-CM | POA: Insufficient documentation

## 2012-05-27 LAB — DIFFERENTIAL
Basophils Absolute: 0 10*3/uL (ref 0.0–0.1)
Lymphocytes Relative: 12 % (ref 12–46)
Monocytes Absolute: 0.5 10*3/uL (ref 0.1–1.0)
Monocytes Relative: 5 % (ref 3–12)
Neutro Abs: 8.9 10*3/uL — ABNORMAL HIGH (ref 1.7–7.7)

## 2012-05-27 LAB — BASIC METABOLIC PANEL
CO2: 23 mEq/L (ref 19–32)
Chloride: 103 mEq/L (ref 96–112)
Creatinine, Ser: 0.6 mg/dL (ref 0.50–1.10)
Potassium: 3.5 mEq/L (ref 3.5–5.1)

## 2012-05-27 LAB — CBC
HCT: 37.8 % (ref 36.0–46.0)
Hemoglobin: 12.4 g/dL (ref 12.0–15.0)
RDW: 14.4 % (ref 11.5–15.5)
WBC: 10.9 10*3/uL — ABNORMAL HIGH (ref 4.0–10.5)

## 2012-05-27 MED ORDER — PROMETHAZINE HCL 25 MG PO TABS: 25.0000 mg | ORAL_TABLET | Freq: Four times a day (QID) | ORAL | Status: DC | PRN

## 2012-05-27 MED ORDER — ONDANSETRON HCL 4 MG/2ML IJ SOLN
4.0000 mg | Freq: Once | INTRAMUSCULAR | Status: AC
  Administered 2012-05-27: 4 mg via INTRAVENOUS
  Filled 2012-05-27: qty 2

## 2012-05-27 MED ORDER — VANCOMYCIN HCL IN DEXTROSE 1-5 GM/200ML-% IV SOLN
1000.0000 mg | Freq: Once | INTRAVENOUS | Status: AC
  Administered 2012-05-27: 1000 mg via INTRAVENOUS
  Filled 2012-05-27: qty 200

## 2012-05-27 MED ORDER — SODIUM CHLORIDE 0.9 % IV SOLN
Freq: Once | INTRAVENOUS | Status: AC
  Administered 2012-05-27: 09:00:00 via INTRAVENOUS

## 2012-05-27 MED ORDER — ONDANSETRON 4 MG PO TBDP: 4.0000 mg | ORAL_TABLET | Freq: Three times a day (TID) | ORAL | Status: AC | PRN

## 2012-05-27 NOTE — ED Notes (Signed)
Patient called out stating her wound was leaking put a blue chuck underneath patient. Changed linen.

## 2012-05-27 NOTE — ED Provider Notes (Signed)
Medical screening examination/treatment/procedure(s) were performed by non-physician practitioner and as supervising physician I was immediately available for consultation/collaboration.   Shelda Jakes, MD 05/27/12 2115

## 2012-05-27 NOTE — ED Notes (Signed)
BC x 2 drawn prior to admininstration of Vancomycin

## 2012-05-27 NOTE — ED Provider Notes (Signed)
Medical screening examination/treatment/procedure(s) were performed by non-physician practitioner and as supervising physician I was immediately available for consultation/collaboration.  Geoffery Lyons, MD 05/27/12 1119

## 2012-05-27 NOTE — ED Notes (Signed)
Dressing placed on pt's left buttocks wound. Pt verbalized understanding of wound care.

## 2012-05-27 NOTE — Discharge Instructions (Signed)
Cellulitis Cellulitis is an infection of the tissue under the skin. The infected area is usually red and tender. This is caused by germs. These germs enter the body through cuts or sores. This usually happens in the arms or lower legs. HOME CARE   Take your medicine as told. Finish it even if you start to feel better.   If the infection is on the arm or leg, keep it raised (elevated).   Use a warm cloth on the infected area several times a day.   See your doctor for a follow-up visit as told.  GET HELP RIGHT AWAY IF:   You are tired or confused.   You throw up (vomit).   You have watery poop (diarrhea).   You feel ill and have muscle aches.   You have a fever.  MAKE SURE YOU:   Understand these instructions.   Will watch your condition.   Will get help right away if you are not doing well or get worse.  Document Released: 05/11/2008 Document Revised: 11/12/2011 Document Reviewed: 10/25/2009 [facility] Patient Information 2012 [place], [place].  Return tomorrow for re-check.  You may need additional IV antibiotics.

## 2012-05-27 NOTE — ED Notes (Signed)
Pt here for wound recheck, had abcess to left buttock drained and packing placed on Tuesday, has been running low grade fever at home and area still painful

## 2012-05-27 NOTE — ED Provider Notes (Signed)
History     CSN: [id number]  Arrival date & time 05/27/12  4098   First MD Initiated Contact with Patient 05/27/12 [phone]      Chief Complaint  Patient presents with  . Wound Check    (Consider location/radiation/quality/duration/timing/severity/associated sxs/prior treatment) HPI Comments: Pt had I&D of abscess of L buttock 3 days ago.  Taking doxycycline and flagyl.  Has had subjective fever and chills.  Taking phenergan for nausea with minimal effect.  RN yoder here today and was here  On day of pt's initial visit.  She states the surrounding buttock region redness is all new.  Patient is a 66 y.o. female presenting with wound check. The history is provided by the patient. No language interpreter was used.  Wound Check  Treated in ED: 3 days ago. Treatments since wound repair include antibiotic ointment use. Her temperature was unmeasured prior to arrival. There has been colored discharge from the wound. There is new redness present. The swelling has not changed. The pain has not changed.    Past Medical History  Diagnosis Date  . PMR (polymyalgia rheumatica)   . Giant cell arteritis   . Acute coronary syndrome   . Diabetes mellitus   . Glaucoma   . Pyelonephritis   . Asthma   . Hypersomnia   . Dyspnea   . Chest pain   . Osteoarthritis   . Migraine   . Macular pucker   . C. difficile colitis     Past Surgical History  Procedure Date  . Knee surgery   . Cholecystectomy   . Appendectomy   . Finger surgery   . Foot surgery   . Coronary stent placement   . Skin biopsy     No family history on file.  History  Substance Use Topics  . Smoking status: Never Smoker   . Smokeless tobacco: Not on file  . Alcohol Use: No    OB History    Grav Para Term Preterm Abortions TAB SAB Ect Mult Living                  Review of Systems  Constitutional: Positive for fever and chills.  Musculoskeletal:       Abscess and cellulitis   All other systems reviewed and are  negative.    Allergies  Ambien; Betadine; Carisoprodol; Celebrex; Enalapril; Hctz; Iodine; Lasix; Maxidex; Motrin; and Theophyllines  Home Medications   Current Outpatient Rx  Name Route Sig Dispense Refill  . DOXYCYCLINE HYCLATE 100 MG PO CAPS Oral Take 1 capsule (100 mg total) by mouth 2 (two) times daily. 20 capsule 0  . HYDROCODONE-ACETAMINOPHEN 5-325 MG PO TABS Oral Take 1 tablet by mouth every 4 (four) hours as needed for pain. 20 tablet 0  . METRONIDAZOLE 500 MG PO TABS Oral Take 1 tablet (500 mg total) by mouth 2 (two) times daily. 14 tablet 0    BP 126/74  Pulse 96  Temp 97.5 F (36.4 C) (Oral)  Resp 20  SpO2 98%  Physical Exam  Nursing note and vitals reviewed. Constitutional: She is oriented to person, place, and time. She appears well-developed and well-nourished. No distress.  HENT:  Head: Normocephalic and atraumatic.  Eyes: EOM are normal.  Neck: Normal range of motion.  Cardiovascular: Normal rate, regular rhythm and normal heart sounds.   Pulmonary/Chest: Effort normal and breath sounds normal.  Abdominal: Soft. She exhibits no distension. There is no tenderness.  Musculoskeletal: Normal range of motion.       ~  2 inch diam area of induration with central incision and protruding packing.  Entire L buttock red c/w cellulitis.  Neurological: She is alert and oriented to person, place, and time.  Skin: Skin is warm and dry.  Psychiatric: She has a normal mood and affect. Judgment normal.    ED Course  Procedures (including critical care time)   Labs Reviewed  CBC  DIFFERENTIAL  BASIC METABOLIC PANEL  CULTURE, BLOOD (ROUTINE X 2)  CULTURE, BLOOD (ROUTINE X 2)   No results found.   No diagnosis found.    MDM  Abscess has developed into cellulitis as well.  Return tomorrow for re-check.  May need additional IV abx.        Worthy Rancher, PA 05/27/12 [phone]

## 2012-05-28 ENCOUNTER — Emergency Department (HOSPITAL_COMMUNITY)
Admission: EM | Admit: 2012-05-28 | Discharge: 2012-05-28 | Disposition: A | Discharge status: Home / Self Care | Payer: Medicare Other | Attending: Emergency Medicine | Admitting: Emergency Medicine

## 2012-05-28 DIAGNOSIS — J45909 Unspecified asthma, uncomplicated: Secondary | ICD-10-CM | POA: Insufficient documentation

## 2012-05-28 DIAGNOSIS — M353 Polymyalgia rheumatica: Secondary | ICD-10-CM | POA: Insufficient documentation

## 2012-05-28 DIAGNOSIS — Z79899 Other long term (current) drug therapy: Secondary | ICD-10-CM | POA: Insufficient documentation

## 2012-05-28 DIAGNOSIS — Z5189 Encounter for other specified aftercare: Secondary | ICD-10-CM

## 2012-05-28 DIAGNOSIS — L0231 Cutaneous abscess of buttock: Secondary | ICD-10-CM | POA: Insufficient documentation

## 2012-05-28 DIAGNOSIS — Z9861 Coronary angioplasty status: Secondary | ICD-10-CM | POA: Insufficient documentation

## 2012-05-28 DIAGNOSIS — Z4800 Encounter for change or removal of nonsurgical wound dressing: Secondary | ICD-10-CM | POA: Insufficient documentation

## 2012-05-28 DIAGNOSIS — I252 Old myocardial infarction: Secondary | ICD-10-CM | POA: Insufficient documentation

## 2012-05-28 DIAGNOSIS — E119 Type 2 diabetes mellitus without complications: Secondary | ICD-10-CM | POA: Insufficient documentation

## 2012-05-28 DIAGNOSIS — Z794 Long term (current) use of insulin: Secondary | ICD-10-CM | POA: Insufficient documentation

## 2012-05-28 MED ORDER — VANCOMYCIN HCL IN DEXTROSE 1-5 GM/200ML-% IV SOLN
1000.0000 mg | Freq: Once | INTRAVENOUS | Status: AC
  Administered 2012-05-28: 1000 mg via INTRAVENOUS
  Filled 2012-05-28: qty 200

## 2012-05-28 MED ORDER — SODIUM CHLORIDE 0.9 % IV SOLN
INTRAVENOUS | Status: DC
  Administered 2012-05-28: 14:00:00 via INTRAVENOUS

## 2012-05-28 MED ORDER — SODIUM CHLORIDE 0.9 % IV BOLUS (SEPSIS)
500.0000 mL | INTRAVENOUS | Status: AC
  Administered 2012-05-28: 500 mL via INTRAVENOUS

## 2012-05-28 NOTE — Discharge Instructions (Signed)
Continue your oral antibiotics. Return if you get a fever, the pain gets worse, or you feel you are getting worse in any way.

## 2012-05-28 NOTE — ED Notes (Signed)
Pt states she has an abscess to her buttocks. States she needs  Another dose of antibiotics

## 2012-05-28 NOTE — ED Provider Notes (Signed)
History    This chart was scribed for Ward Givens, MD, MD by Smitty Pluck. The patient was seen in room APA10 and the patient's care was started at 1:32PM.   CSN: [id number]  Arrival date & time 05/28/12  1040   First MD Initiated Contact with Patient 05/28/12 1302      Chief Complaint  Patient presents with  . Wound Check    (Consider location/radiation/quality/duration/timing/severity/associated sxs/prior treatment) The history is provided by the patient.   Kelli Stewart is a 66 y.o. female who presents to the Emergency Department due to wound check. Pt had abscess and cellulitis  on her left buttock. She was in [facility] Tuesday 4 days ago and in ED she had abscess lanced and was given flagyl and doxycycline. She returned yesterday and was given IV ATBs. She had purulent drainage last night when she changed the dressing. She was told to come back today for IV abx. She states she still has moderate pain from abscess, but states she feels improved. She states she had fever Tuesday. She had nausea and was written prescription for phenergan. Pt has had diarrhea 6x since Feb when she had her first episode of C Diff.. Pt had c.diff 3 weeks ago also and her stool has been watery since then.  Pt denies smoking and drinking alcohol. Pt had Lyme disease in September 2012. Pt has hx of PMR and Giant Cell Arteritis.  PCP Dr. Charlynne Cousins in [place]   Past Medical History  Diagnosis Date  . PMR (polymyalgia rheumatica)   . Giant cell arteritis   . Acute coronary syndrome   . Diabetes mellitus   . Glaucoma   . Pyelonephritis   . Asthma   . Hypersomnia   . Dyspnea   . Chest pain   . Osteoarthritis   . Migraine   . Macular pucker   . C. difficile colitis     Past Surgical History  Procedure Date  . Knee surgery   . Cholecystectomy   . Appendectomy   . Finger surgery   . Foot surgery   . Coronary stent placement   . Skin biopsy     No family history on file.  History    Substance Use Topics  . Smoking status: Never Smoker   . Smokeless tobacco: Not on file  . Alcohol Use: No  lives alone  OB History    Grav Para Term Preterm Abortions TAB SAB Ect Mult Living                  Review of Systems  All other systems reviewed and are negative.  10 Systems reviewed and all are negative for acute change except as noted in the HPI.    Allergies  Food; Iodine; Motrin; Ambien; Betadine; Carisoprodol; Celebrex; Enalapril; Hctz; Lasix; Maxidex; and Theophyllines  Home Medications   Current Outpatient Rx  Name Route Sig Dispense Refill  . ACETAMINOPHEN 500 MG PO TABS Oral Take 1,000 mg by mouth every 6 (six) hours as needed. FOR PAIN    . ACETAMINOPHEN-CODEINE #3 300-30 MG PO TABS Oral Take 1 tablet by mouth 3 (three) times daily as needed. For pain    . ALBUTEROL SULFATE HFA 108 (90 BASE) MCG/ACT IN AERS Inhalation Inhale 2 puffs into the lungs every 6 (six) hours as needed. For shortness of breath/wheezing    . IPRATROPIUM-ALBUTEROL 18-103 MCG/ACT IN AERO Inhalation Inhale 2 puffs into the lungs every 6 (six) hours as needed.  FOR SHORTNESS OF BREATH    . ASPIRIN EC 81 MG PO TBEC Oral Take 81 mg by mouth daily.    Marland Kitchen BRIMONIDINE TARTRATE 0.1 % OP SOLN Ophthalmic Apply 1 drop to eye 2 (two) times daily. Daily at 8 am and 5 pm    . CALCIUM CITRATE-VITAMIN D 315-200 MG-UNIT PO TABS Oral Take 1 tablet by mouth daily.    Marland Kitchen CLOPIDOGREL BISULFATE 75 MG PO TABS Oral Take 75 mg by mouth daily.    Marland Kitchen DOXYCYCLINE HYCLATE 100 MG PO CAPS Oral Take 100 mg by mouth 2 (two) times daily. FOR 7 DAYS    . IRON 18 MG PO TBCR Oral Take 1 tablet by mouth daily.    Marland Kitchen FLUTICASONE-SALMETEROL 100-50 MCG/DOSE IN AEPB Inhalation Inhale 1 puff into the lungs every 12 (twelve) hours.    Marland Kitchen GABAPENTIN 400 MG PO CAPS Oral Take 400 mg by mouth 3 (three) times daily.    Marland Kitchen GLIMEPIRIDE 2 MG PO TABS Oral Take 2 mg by mouth daily before breakfast.    . GLUCOSAMINE 1500 COMPLEX PO Oral Take 1  tablet by mouth 3 (three) times daily.    Marland Kitchen HYDROCODONE-ACETAMINOPHEN 5-325 MG PO TABS Oral Take 1 tablet by mouth every 4 (four) hours as needed. FOR PAIN    . INSULIN GLARGINE 100 UNIT/ML [place] SOLN Subcutaneous Inject 43 Units into the skin at bedtime.     . ISOSORBIDE MONONITRATE ER 60 MG PO TB24 Oral Take 60 mg by mouth daily.    Marland Kitchen LAMOTRIGINE 25 MG PO TABS Oral Take 25 mg by mouth 2 (two) times daily.    Marland Kitchen LATANOPROST 0.005 % OP SOLN Both Eyes Place 1 drop into both eyes at bedtime.    Marland Kitchen LORATADINE 10 MG PO TABS Oral Take 10 mg by mouth daily.    Marland Kitchen METFORMIN HCL 1000 MG PO TABS Oral Take 1,000 mg by mouth 2 (two) times daily with a meal.    . METRONIDAZOLE 500 MG PO TABS Oral Take 500 mg by mouth 2 (two) times daily.    Marland Kitchen MONTELUKAST SODIUM 10 MG PO TABS Oral Take 10 mg by mouth at bedtime.    Marland Kitchen NITROGLYCERIN 0.4 MG SL SUBL Sublingual Place 0.4 mg under the tongue every 5 (five) minutes x 3 doses as needed. For chest pain. Do not exceed 3 tablets in 15 minutes    . PREDNISONE 10 MG PO TABS Oral Take 10 mg by mouth daily.    Marland Kitchen PROMETHAZINE HCL 25 MG PO TABS Oral Take 1 tablet (25 mg total) by mouth every 6 (six) hours as needed for nausea. 12 tablet 0  . ROSUVASTATIN CALCIUM 20 MG PO TABS Oral Take 20 mg by mouth daily.    . TRAMADOL HCL 50 MG PO TABS Oral Take 50-100 mg by mouth every 8 (eight) hours as needed. For pain    . VERAPAMIL HCL ER 240 MG PO CP24 Oral Take 240 mg by mouth at bedtime.    Marland Kitchen ONDANSETRON 4 MG PO TBDP Oral Take 1 tablet (4 mg total) by mouth every 8 (eight) hours as needed for nausea. 10 tablet 0    BP 119/66  Pulse 79  Temp 97.9 F (36.6 C) (Oral)  Resp 16  Ht 5\' 7"  (1.702 m)  Wt 194 lb (87.998 kg)  BMI 30.38 kg/m2  SpO2 100%  Vital signs normal    Physical Exam  Nursing note and vitals reviewed. Constitutional: She is oriented to person,  place, and time. She appears well-developed and well-nourished. No distress.  HENT:  Head: Normocephalic and  atraumatic.  Right Ear: External ear normal.  Left Ear: External ear normal.  Nose: Nose normal.  Mouth/Throat: Oropharynx is clear and moist. Mucous membranes are dry.  Eyes: Conjunctivae and EOM are normal. Pupils are equal, round, and reactive to light.  Neck: Normal range of motion. Neck supple.  Cardiovascular: Normal rate, regular rhythm and normal heart sounds.   Pulmonary/Chest: Effort normal and breath sounds normal. No respiratory distress.  Neurological: She is alert and oriented to person, place, and time.  Skin: Skin is warm and dry.       Left buttock has redness 3.5 cm. Faint redness. The dressing has large amount of bloody pus drainage. Packing still in place  Psychiatric: She has a normal mood and affect. Her behavior is normal.    ED Course  Procedures (including critical care time)   Medications  vancomycin (VANCOCIN) IVPB 1000 mg/200 mL premix (1000 mg Intravenous Given 05/28/12 1449)  sodium chloride 0.9 % bolus 500 mL (500 mL Intravenous Given 05/28/12 1408)   Pt states she is feeling better.   DIAGNOSTIC STUDIES: Oxygen Saturation is 100% on room air, normal by my interpretation.    COORDINATION OF CARE: 1:45PM EDP ordered medication: vancocin 1000 mg/ 200 ml, 0.9% NaCl infusion  Al Decant, PA saw patient yesterday and looked at her abscess and states it looks better than yesterday.   3:03 PM EDP rechecks pt. Pt is feeling better. Packing pulled out. Pt is ready for discharge.    1. Abscess re-check     Plan discharge  Devoria Albe, MD, FACEP   MDM   I personally performed the services described in this documentation, which was scribed in my presence. The recorded information has been reviewed and considered.  Devoria Albe, MD, Armando Gang       Ward Givens, MD 05/28/12 2042

## 2012-06-01 LAB — CULTURE, BLOOD (ROUTINE X 2): Culture: NO GROWTH

## 2012-07-04 ENCOUNTER — Emergency Department (HOSPITAL_COMMUNITY): Payer: Medicare Other

## 2012-07-04 ENCOUNTER — Encounter (HOSPITAL_COMMUNITY): Payer: Self-pay | Admitting: *Deleted

## 2012-07-04 ENCOUNTER — Emergency Department (HOSPITAL_COMMUNITY)
Admission: EM | Admit: 2012-07-04 | Discharge: 2012-07-04 | Disposition: A | Discharge status: Home / Self Care | Payer: Medicare Other | Attending: Emergency Medicine | Admitting: Emergency Medicine

## 2012-07-04 DIAGNOSIS — M1990 Unspecified osteoarthritis, unspecified site: Secondary | ICD-10-CM | POA: Insufficient documentation

## 2012-07-04 DIAGNOSIS — L03311 Cellulitis of abdominal wall: Secondary | ICD-10-CM

## 2012-07-04 DIAGNOSIS — Z794 Long term (current) use of insulin: Secondary | ICD-10-CM | POA: Insufficient documentation

## 2012-07-04 DIAGNOSIS — E119 Type 2 diabetes mellitus without complications: Secondary | ICD-10-CM | POA: Insufficient documentation

## 2012-07-04 DIAGNOSIS — Z79899 Other long term (current) drug therapy: Secondary | ICD-10-CM | POA: Insufficient documentation

## 2012-07-04 DIAGNOSIS — L02219 Cutaneous abscess of trunk, unspecified: Secondary | ICD-10-CM | POA: Insufficient documentation

## 2012-07-04 MED ORDER — CLINDAMYCIN PHOSPHATE 600 MG/50ML IV SOLN
600.0000 mg | Freq: Once | INTRAVENOUS | Status: AC
  Administered 2012-07-04: 600 mg via INTRAVENOUS
  Filled 2012-07-04: qty 50

## 2012-07-04 MED ORDER — CLINDAMYCIN HCL 150 MG PO CAPS: 300.0000 mg | ORAL_CAPSULE | Freq: Three times a day (TID) | ORAL | Status: AC

## 2012-07-04 NOTE — ED Notes (Signed)
EDP at patients bedside  

## 2012-07-04 NOTE — ED Notes (Signed)
Abdominal abscess. Hx of PMR/Giant cell ateritis. States she will sometimes develop abscesses that require IV clindamycin. Abscess to right lower abdomen.

## 2012-07-04 NOTE — ED Provider Notes (Signed)
History  This chart was scribed for [employer]. Rubin Payor, MD by Bennett Scrape. This patient was seen in room APA03/APA03 and the patient's care was started at 12:07PM.  CSN: [id number]  Arrival date & time 07/04/12  1113   First MD Initiated Contact with Patient 07/04/12 1207      Chief Complaint  Patient presents with  . Abscess    The history is provided by the patient. No language interpreter was used.    Kelli Stewart is a 66 y.o. female who presents to the Emergency Department complaining of 6 days of a gradual onset, gradually worsening, constant abscess to the RLQ with one day of associated 100 fever. She reports taking tylenol and using hot compresses with improvement in her symptoms. She states that she started using an old amoxicillin prescription 5 days ago with no improvement in the symptoms. She has a h/o abscesses and states that she was told by her PCP to increase the dosage when she had an abscess to help fight the infection. She reports that she is currently on 10 mg of prednisone daily but states that she increased the dosage to 15 mg for the past 2 days. She also c/o right foot pain that is worse with walking. She compares the pain to when she had a stress fracture and states that she wants to make sure she doesn't have another stress fracture. She denies any recent falls or injuries. She denies chills, nausea, emesis, rash, and abdominal pain as associated symptoms. She has a h/o PMR, DM and pyelonephritis. She denies being on any other immunosuppressant medications. She denies smoking and alcohol use.  PCP is Dr. Felecia Shelling.  Past Medical History  Diagnosis Date  . PMR (polymyalgia rheumatica)   . Giant cell arteritis   . Acute coronary syndrome   . Diabetes mellitus   . Glaucoma   . Pyelonephritis   . Asthma   . Hypersomnia   . Dyspnea   . Chest pain   . Osteoarthritis   . Migraine   . Macular pucker   . C. difficile colitis     Past Surgical History    Procedure Date  . Knee surgery   . Cholecystectomy   . Appendectomy   . Finger surgery   . Foot surgery   . Coronary stent placement   . Skin biopsy     No family history on file.  History  Substance Use Topics  . Smoking status: Never Smoker   . Smokeless tobacco: Not on file  . Alcohol Use: No    No OB history provided.  Review of Systems  Constitutional: Positive for fever. Negative for chills.  Gastrointestinal: Negative for nausea, vomiting, abdominal pain and diarrhea.  Musculoskeletal: Negative for back pain.       Right foot pain  Skin: Negative for rash.       Abscess     Allergies  Food; Iodine; Motrin; Ambien; Betadine; Carisoprodol; Celebrex; Enalapril; Hctz; Lasix; Maxidex; Maxzide; and Theophyllines  Home Medications   Current Outpatient Rx  Name Route Sig Dispense Refill  . ACETAMINOPHEN-CODEINE #3 300-30 MG PO TABS Oral Take 1 tablet by mouth 3 (three) times daily as needed. For pain    . ALBUTEROL SULFATE HFA 108 (90 BASE) MCG/ACT IN AERS Inhalation Inhale 2 puffs into the lungs every 6 (six) hours as needed. For shortness of breath/wheezing    . IPRATROPIUM-ALBUTEROL 18-103 MCG/ACT IN AERO Inhalation Inhale 2 puffs into the lungs every 6 (six)  hours as needed. FOR SHORTNESS OF BREATH    . AMOXICILLIN 500 MG PO CAPS Oral Take 500 mg by mouth 2 (two) times daily.    . ASPIRIN EC 81 MG PO TBEC Oral Take 81 mg by mouth daily.    Marland Kitchen BRIMONIDINE TARTRATE 0.1 % OP SOLN Ophthalmic Apply 1 drop to eye 2 (two) times daily. Daily at 8 am and 5 pm    . CALCIUM CITRATE-VITAMIN D 315-200 MG-UNIT PO TABS Oral Take 1 tablet by mouth daily.    Marland Kitchen CLOPIDOGREL BISULFATE 75 MG PO TABS Oral Take 75 mg by mouth daily.    . IRON 18 MG PO TBCR Oral Take 1 tablet by mouth daily.    Marland Kitchen FLUTICASONE-SALMETEROL 100-50 MCG/DOSE IN AEPB Inhalation Inhale 1 puff into the lungs every 12 (twelve) hours.    Marland Kitchen GABAPENTIN 400 MG PO CAPS Oral Take 400 mg by mouth 3 (three) times daily.     Marland Kitchen GLIMEPIRIDE 2 MG PO TABS Oral Take 2 mg by mouth daily before breakfast.    . GLUCOSAMINE 1500 COMPLEX PO Oral Take 1 tablet by mouth 3 (three) times daily.    . INSULIN GLARGINE 100 UNIT/ML [place] SOLN Subcutaneous Inject 43 Units into the skin at bedtime.     . ISOSORBIDE MONONITRATE ER 60 MG PO TB24 Oral Take 60 mg by mouth daily.    Marland Kitchen LAMOTRIGINE 25 MG PO TABS Oral Take 25 mg by mouth 2 (two) times daily.    Marland Kitchen LATANOPROST 0.005 % OP SOLN Both Eyes Place 1 drop into both eyes at bedtime.    Marland Kitchen LORATADINE 10 MG PO TABS Oral Take 10 mg by mouth daily.    Marland Kitchen METFORMIN HCL 1000 MG PO TABS Oral Take 1,000 mg by mouth 2 (two) times daily with a meal.    . METRONIDAZOLE 500 MG PO TABS Oral Take 500 mg by mouth 4 (four) times daily.    Marland Kitchen MONTELUKAST SODIUM 10 MG PO TABS Oral Take 10 mg by mouth at bedtime.    Marland Kitchen PREDNISONE 10 MG PO TABS Oral Take 15 mg by mouth daily.     Marland Kitchen ROSUVASTATIN CALCIUM 20 MG PO TABS Oral Take 20 mg by mouth every evening.     Marland Kitchen TRAMADOL HCL 50 MG PO TABS Oral Take 50-100 mg by mouth every 8 (eight) hours as needed. For pain    . VERAPAMIL HCL ER 240 MG PO CP24 Oral Take 240 mg by mouth at bedtime.    . ACETAMINOPHEN 500 MG PO TABS Oral Take 1,000 mg by mouth every 6 (six) hours as needed. FOR PAIN    . CLINDAMYCIN HCL 150 MG PO CAPS Oral Take 2 capsules (300 mg total) by mouth 3 (three) times daily. 30 capsule 0  . NITROGLYCERIN 0.4 MG SL SUBL Sublingual Place 0.4 mg under the tongue every 5 (five) minutes x 3 doses as needed. For chest pain. Do not exceed 3 tablets in 15 minutes      There were no vitals taken for this visit.  Physical Exam  Nursing note and vitals reviewed. Constitutional: She is oriented to person, place, and time. She appears well-developed and well-nourished. No distress.  HENT:  Head: Normocephalic and atraumatic.  Eyes: EOM are normal.  Neck: Neck supple. No tracheal deviation present.  Cardiovascular: Normal rate.   Pulmonary/Chest: Effort  normal. No respiratory distress.  Musculoskeletal: Normal range of motion.       Tender along the lateral right foot,  no bruising or edema noted  Neurological: She is alert and oriented to person, place, and time.  Skin: Skin is warm and dry.       2 by 4 cm firm subcutaneous mass in the RLQ  Psychiatric: She has a normal mood and affect. Her behavior is normal.    ED Course  Procedures (including critical care time)  COORDINATION OF CARE: 12:10PM-Discussed treatment plan of IV clindamycin and x-ray with pt at bedside and pt agreed to plan.   Labs Reviewed - No data to display Dg Foot Complete Right  07/04/2012  *RADIOLOGY REPORT*  Clinical Data: Fifth metatarsal pain  RIGHT FOOT COMPLETE - 3+ VIEW  Comparison: None.  Findings: No fracture evident.  Metatarsals appear intact.  Slight medial deviation of the right fifth MTP joint noted.  This appears to be chronic.  No radiopaque foreign body.  Peripheral vascular calcifications noted on the lateral view  IMPRESSION: No acute osseous finding.  Original Report Authenticated By: Judie Petit. Ruel Favors, M.D.     1. Cellulitis of abdominal wall       MDM  Patient with abscess versus cellulitis the right abdomen. Is approximately 2 cm x 4 cm. It is firm. It does not appear to be ready for drainage at this time. Patient has been on amoxicillin she had left over. She's given a dose of IV clindamycin and will be discharged on oral clindamycin. She was instructed to do warm soaks 3 times a day. She'll followup in one to 2 days for recheck and possible I&D at that time. She also has some chronic right foot pain. No osseous finding on x-ray. She'll followup as needed.     I personally performed the services described in this documentation, which was scribed in my presence. The recorded information has been reviewed and considered. Juliet Rude. Rubin Payor, MD   Juliet Rude. Rubin Payor, MD 07/04/12 1359

## 2012-07-06 ENCOUNTER — Emergency Department (HOSPITAL_COMMUNITY)
Admission: EM | Admit: 2012-07-06 | Discharge: 2012-07-06 | Disposition: A | Discharge status: Home / Self Care | Payer: Medicare Other | Attending: Emergency Medicine | Admitting: Emergency Medicine

## 2012-07-06 ENCOUNTER — Encounter (HOSPITAL_COMMUNITY): Payer: Self-pay | Admitting: Emergency Medicine

## 2012-07-06 DIAGNOSIS — M1990 Unspecified osteoarthritis, unspecified site: Secondary | ICD-10-CM | POA: Insufficient documentation

## 2012-07-06 DIAGNOSIS — L02219 Cutaneous abscess of trunk, unspecified: Secondary | ICD-10-CM | POA: Insufficient documentation

## 2012-07-06 DIAGNOSIS — J45909 Unspecified asthma, uncomplicated: Secondary | ICD-10-CM | POA: Insufficient documentation

## 2012-07-06 DIAGNOSIS — Z9861 Coronary angioplasty status: Secondary | ICD-10-CM | POA: Insufficient documentation

## 2012-07-06 DIAGNOSIS — L03319 Cellulitis of trunk, unspecified: Secondary | ICD-10-CM | POA: Insufficient documentation

## 2012-07-06 DIAGNOSIS — Z79899 Other long term (current) drug therapy: Secondary | ICD-10-CM | POA: Insufficient documentation

## 2012-07-06 DIAGNOSIS — L02211 Cutaneous abscess of abdominal wall: Secondary | ICD-10-CM

## 2012-07-06 DIAGNOSIS — Z794 Long term (current) use of insulin: Secondary | ICD-10-CM | POA: Insufficient documentation

## 2012-07-06 DIAGNOSIS — Z8614 Personal history of Methicillin resistant Staphylococcus aureus infection: Secondary | ICD-10-CM | POA: Insufficient documentation

## 2012-07-06 DIAGNOSIS — E119 Type 2 diabetes mellitus without complications: Secondary | ICD-10-CM | POA: Insufficient documentation

## 2012-07-06 MED ORDER — LIDOCAINE HCL (PF) 2 % IJ SOLN
INTRAMUSCULAR | Status: AC
  Filled 2012-07-06: qty 10

## 2012-07-06 MED ORDER — LIDOCAINE HCL (PF) 2 % IJ SOLN
10.0000 mL | Freq: Once | INTRAMUSCULAR | Status: AC
  Administered 2012-07-06: 11:00:00

## 2012-07-06 NOTE — ED Notes (Signed)
Wound recheck to r abd. States feels better. Nad.

## 2012-07-06 NOTE — ED Provider Notes (Signed)
History     CSN: [id number]  Arrival date & time 07/06/12  1019   First MD Initiated Contact with Patient 07/06/12 1026      Chief Complaint  Patient presents with  . Wound Check    (Consider location/radiation/quality/duration/timing/severity/associated sxs/prior treatment) HPI Comments: Patient states that she has an abscess of the right abdomen. She was seen in the emergency department a few days ago at which time it was deemed a better treatment would be warm soaks and antibiotics. The patient states that the wound has now come to [facility] and this actually" leaking a little bit" his been no fever or chills reported. Patient has some pain at the abscess site. 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  The history is provided by the patient.    Past Medical History  Diagnosis Date  . PMR (polymyalgia rheumatica)   . Giant cell arteritis   . Acute coronary syndrome   . Diabetes mellitus   . Glaucoma   . Pyelonephritis   . Asthma   . Hypersomnia   . Dyspnea   . Chest pain   . Osteoarthritis   . Migraine   . Macular pucker   . C. difficile colitis     Past Surgical History  Procedure Date  . Knee surgery   . Cholecystectomy   . Appendectomy   . Finger surgery   . Foot surgery   . Coronary stent placement   . Skin biopsy     History reviewed. No pertinent family history.  History  Substance Use Topics  . Smoking status: Never Smoker   . Smokeless tobacco: Not on file  . Alcohol Use: No    OB History    Grav Para Term Preterm Abortions TAB SAB Ect Mult Living                  Review of Systems  Constitutional: Negative for activity change.       All ROS Neg except as noted in HPI  HENT: Negative for nosebleeds and neck pain.   Eyes: Positive for visual disturbance. Negative for photophobia and discharge.  Respiratory: Positive for wheezing. Negative for cough and shortness of breath.     Cardiovascular: Positive for chest pain. Negative for palpitations.  Gastrointestinal: Positive for abdominal pain. Negative for blood in stool.  Genitourinary: Negative for dysuria, frequency and hematuria.  Musculoskeletal: Positive for back pain, joint swelling and arthralgias.  Skin: Negative.   Neurological: Positive for headaches. Negative for dizziness, seizures and speech difficulty.  Psychiatric/Behavioral: Negative for hallucinations and confusion.    Allergies  Food; Iodine; Motrin; Ambien; Betadine; Carisoprodol; Celebrex; Enalapril; Hctz; Lasix; Maxidex; Maxzide; and Theophyllines  Home Medications   Current Outpatient Rx  Name Route Sig Dispense Refill  . ACETAMINOPHEN 500 MG PO TABS Oral Take 1,000 mg by mouth every 6 (six) hours as needed. FOR PAIN    . ACETAMINOPHEN-CODEINE #3 300-30 MG PO TABS Oral Take 1 tablet by mouth 3 (three) times daily as needed. For pain    . ALBUTEROL SULFATE HFA 108 (90 BASE) MCG/ACT IN AERS Inhalation Inhale 2 puffs into the lungs every 6 (six) hours as needed. For shortness of breath/wheezing    . IPRATROPIUM-ALBUTEROL 18-103 MCG/ACT IN AERO Inhalation Inhale 2 puffs into the lungs every 6 (six) hours as needed. FOR SHORTNESS OF BREATH    . AMOXICILLIN 500 MG PO CAPS Oral Take 500 mg by mouth  2 (two) times daily.    . ASPIRIN EC 81 MG PO TBEC Oral Take 81 mg by mouth daily.    Marland Kitchen BRIMONIDINE TARTRATE 0.1 % OP SOLN Ophthalmic Apply 1 drop to eye 2 (two) times daily. Daily at 8 am and 5 pm    . CALCIUM CITRATE-VITAMIN D 315-200 MG-UNIT PO TABS Oral Take 1 tablet by mouth daily.    Marland Kitchen CLINDAMYCIN HCL 150 MG PO CAPS Oral Take 2 capsules (300 mg total) by mouth 3 (three) times daily. 30 capsule 0  . CLOPIDOGREL BISULFATE 75 MG PO TABS Oral Take 75 mg by mouth daily.    . IRON 18 MG PO TBCR Oral Take 1 tablet by mouth daily.    Marland Kitchen FLUTICASONE-SALMETEROL 100-50 MCG/DOSE IN AEPB Inhalation Inhale 1 puff into the lungs every 12 (twelve) hours.    Marland Kitchen  GABAPENTIN 400 MG PO CAPS Oral Take 400 mg by mouth 3 (three) times daily.    Marland Kitchen GLIMEPIRIDE 2 MG PO TABS Oral Take 2 mg by mouth daily before breakfast.    . GLUCOSAMINE 1500 COMPLEX PO Oral Take 1 tablet by mouth 3 (three) times daily.    . INSULIN GLARGINE 100 UNIT/ML [place] SOLN Subcutaneous Inject 43 Units into the skin at bedtime.     . ISOSORBIDE MONONITRATE ER 60 MG PO TB24 Oral Take 60 mg by mouth daily.    Marland Kitchen LAMOTRIGINE 25 MG PO TABS Oral Take 25 mg by mouth 2 (two) times daily.    Marland Kitchen LATANOPROST 0.005 % OP SOLN Both Eyes Place 1 drop into both eyes at bedtime.    Marland Kitchen LORATADINE 10 MG PO TABS Oral Take 10 mg by mouth daily.    Marland Kitchen METFORMIN HCL 1000 MG PO TABS Oral Take 1,000 mg by mouth 2 (two) times daily with a meal.    . METRONIDAZOLE 500 MG PO TABS Oral Take 500 mg by mouth 4 (four) times daily.    Marland Kitchen MONTELUKAST SODIUM 10 MG PO TABS Oral Take 10 mg by mouth at bedtime.    Marland Kitchen NITROGLYCERIN 0.4 MG SL SUBL Sublingual Place 0.4 mg under the tongue every 5 (five) minutes x 3 doses as needed. For chest pain. Do not exceed 3 tablets in 15 minutes    . PREDNISONE 10 MG PO TABS Oral Take 15 mg by mouth daily.     Marland Kitchen ROSUVASTATIN CALCIUM 20 MG PO TABS Oral Take 20 mg by mouth every evening.     Marland Kitchen TRAMADOL HCL 50 MG PO TABS Oral Take 50-100 mg by mouth every 8 (eight) hours as needed. For pain    . VERAPAMIL HCL ER 240 MG PO CP24 Oral Take 240 mg by mouth at bedtime.      BP 122/66  Pulse 87  Temp 98.2 F (36.8 C)  Resp 17  SpO2 96%  Physical Exam  Nursing note and vitals reviewed. Constitutional: She is oriented to person, place, and time. She appears well-developed and well-nourished.  Non-toxic appearance.  HENT:  Head: Normocephalic.  Right Ear: Tympanic membrane and external ear normal.  Left Ear: Tympanic membrane and external ear normal.  Eyes: EOM and lids are normal. Pupils are equal, round, and reactive to light.  Neck: Normal range of motion. Neck supple. Carotid bruit is not  present.  Cardiovascular: Normal rate, regular rhythm, normal heart sounds, intact distal pulses and normal pulses.   Pulmonary/Chest: Breath sounds normal. No respiratory distress.  Abdominal: Soft. Bowel sounds are normal. There is no  tenderness. There is no guarding.        There is a moderate sized abscess to the right lower abdomen wall. There is no red streaks appreciated. There is some drainage present.   Musculoskeletal: Normal range of motion.  Lymphadenopathy:       Head (right side): No submandibular adenopathy present.       Head (left side): No submandibular adenopathy present.    She has no cervical adenopathy.  Neurological: She is alert and oriented to person, place, and time. She has normal strength. No cranial nerve deficit or sensory deficit.  Skin: Skin is warm and dry.  Psychiatric: She has a normal mood and affect. Her speech is normal.    ED Course  Procedures (including critical care time)I AND D OF ABSCESS - the patient was identified by ID bracelet. Permission for the procedure was given by the patient. Procedural time out was taken before incision and drainage of abscess to the right lower abdomen. The area was cleansed with safe cleanse. The abscess was infiltrated with 2% plain lidocaine. The area was again cleansed with sure cleanse. Using sterile technique incision and drainage was carried out with copious amount of pus like material removed from the lower abdomen wall abscess. The area was irrigated with saline. And a sterile dressing was applied. Culture was sent to the lab. The patient tolerated the procedure without problem.   Labs Reviewed  CULTURE, ROUTINE-ABSCESS   Dg Foot Complete Right  07/04/2012  *RADIOLOGY REPORT*  Clinical Data: Fifth metatarsal pain  RIGHT FOOT COMPLETE - 3+ VIEW  Comparison: None.  Findings: No fracture evident.  Metatarsals appear intact.  Slight medial deviation of the right fifth MTP joint noted.  This appears to be chronic.  No  radiopaque foreign body.  Peripheral vascular calcifications noted on the lateral view  IMPRESSION: No acute osseous finding.  Original Report Authenticated By: Judie Petit. Ruel Favors, M.D.     No diagnosis found.    MDM  I have reviewed nursing notes, vital signs, and all appropriate lab and imaging results for this patient.  Patient has an abscess of the right abdominal wall. Incision and drainage carried out. Culture sent to the lab. Pt to continue the cleocin. Packing to be removed 8/2. Pt to return sooner if any changes or problem.      Kathie Dike, PA 07/06/12   Kathie Dike, PA 07/06/12 [phone]

## 2012-07-08 ENCOUNTER — Emergency Department (HOSPITAL_COMMUNITY)
Admission: EM | Admit: 2012-07-08 | Discharge: 2012-07-08 | Disposition: A | Discharge status: Home / Self Care | Payer: Medicare Other | Attending: Emergency Medicine | Admitting: Emergency Medicine

## 2012-07-08 ENCOUNTER — Encounter (HOSPITAL_COMMUNITY): Payer: Self-pay | Admitting: Emergency Medicine

## 2012-07-08 DIAGNOSIS — M1990 Unspecified osteoarthritis, unspecified site: Secondary | ICD-10-CM | POA: Insufficient documentation

## 2012-07-08 DIAGNOSIS — Z794 Long term (current) use of insulin: Secondary | ICD-10-CM | POA: Insufficient documentation

## 2012-07-08 DIAGNOSIS — Z79899 Other long term (current) drug therapy: Secondary | ICD-10-CM | POA: Insufficient documentation

## 2012-07-08 DIAGNOSIS — Z5189 Encounter for other specified aftercare: Secondary | ICD-10-CM

## 2012-07-08 DIAGNOSIS — E119 Type 2 diabetes mellitus without complications: Secondary | ICD-10-CM | POA: Insufficient documentation

## 2012-07-08 DIAGNOSIS — Z4801 Encounter for change or removal of surgical wound dressing: Secondary | ICD-10-CM | POA: Insufficient documentation

## 2012-07-08 DIAGNOSIS — J45909 Unspecified asthma, uncomplicated: Secondary | ICD-10-CM | POA: Insufficient documentation

## 2012-07-08 MED ORDER — ONDANSETRON 4 MG PO TBDP
4.0000 mg | ORAL_TABLET | Freq: Once | ORAL | Status: AC
  Administered 2012-07-08: 4 mg via ORAL

## 2012-07-08 MED ORDER — ONDANSETRON 4 MG PO TBDP
ORAL_TABLET | ORAL | Status: AC
  Administered 2012-07-08: 4 mg via ORAL
  Filled 2012-07-08: qty 1

## 2012-07-08 NOTE — ED Provider Notes (Signed)
Medical screening examination/treatment/procedure(s) were performed by non-physician practitioner and as supervising physician I was immediately available for consultation/collaboration.   Joya Gaskins, MD 07/08/12 [phone]

## 2012-07-08 NOTE — ED Provider Notes (Signed)
Medical screening examination/treatment/procedure(s) were performed by non-physician practitioner and as supervising physician I was immediately available for consultation/collaboration.   Joya Gaskins, MD 07/08/12 1053

## 2012-07-08 NOTE — ED Notes (Signed)
Pt here for recheck to Abscess to right lower quad abd area, pt had I&D done two days ago with packing, pt states that she is still experiencing pain but does feel better.

## 2012-07-08 NOTE — ED Notes (Signed)
Patient here for recheck of wound to right lower abd. Patient here 2 days ago and had I&D with packing.

## 2012-07-08 NOTE — ED Notes (Signed)
Pt requesting something to drink and crackers, given per request, states that she is feeling better.

## 2012-07-08 NOTE — ED Notes (Signed)
Pt c/o nausea, requesting something for the nausea. [place] PA notified, additional orders given

## 2012-07-08 NOTE — ED Provider Notes (Signed)
History     CSN: [id number]  Arrival date & time 07/08/12  4098   First MD Initiated Contact with Patient 07/08/12 [phone]      Chief Complaint  Patient presents with  . Wound Check    (Consider location/radiation/quality/duration/timing/severity/associated sxs/prior treatment) Patient is a 66 y.o. female presenting with wound check. The history is provided by the patient.  Wound Check  She was treated in the ED 2 to 3 days ago. Previous treatment in the ED includes I&D of abscess. Treatments since wound repair include oral antibiotics. The fever has been present for less than 1 day. The maximum temperature noted was 100.4 to 100.9 F. There has been bloody discharge from the wound. The redness has improved. The swelling has improved. The pain has improved.    Past Medical History  Diagnosis Date  . PMR (polymyalgia rheumatica)   . Giant cell arteritis   . Acute coronary syndrome   . Diabetes mellitus   . Glaucoma   . Pyelonephritis   . Asthma   . Hypersomnia   . Dyspnea   . Chest pain   . Osteoarthritis   . Migraine   . Macular pucker   . C. difficile colitis     Past Surgical History  Procedure Date  . Knee surgery   . Cholecystectomy   . Appendectomy   . Finger surgery   . Foot surgery   . Coronary stent placement   . Skin biopsy     History reviewed. No pertinent family history.  History  Substance Use Topics  . Smoking status: Never Smoker   . Smokeless tobacco: Never Used  . Alcohol Use: No    OB History    Grav Para Term Preterm Abortions TAB SAB Ect Mult Living   3 2 2  1  1   2       Review of Systems  Constitutional: Negative.   Eyes: Positive for visual disturbance.  Respiratory: Negative.   Cardiovascular: Positive for chest pain.  Musculoskeletal: Positive for back pain and arthralgias.  Skin:       abscess  Neurological: Positive for headaches.  Hematological: Bruises/bleeds easily.    Allergies  Food; Iodine; Motrin; Ambien;  Betadine; Carisoprodol; Celebrex; Enalapril; Hctz; Lasix; Maxidex; Maxzide; and Theophyllines  Home Medications   Current Outpatient Rx  Name Route Sig Dispense Refill  . ACETAMINOPHEN 500 MG PO TABS Oral Take 1,000 mg by mouth every 6 (six) hours as needed. FOR PAIN    . ACETAMINOPHEN-CODEINE #3 300-30 MG PO TABS Oral Take 1 tablet by mouth 3 (three) times daily as needed. For pain    . ALBUTEROL SULFATE HFA 108 (90 BASE) MCG/ACT IN AERS Inhalation Inhale 2 puffs into the lungs every 6 (six) hours as needed. For shortness of breath/wheezing    . ASPIRIN EC 81 MG PO TBEC Oral Take 81 mg by mouth at bedtime.     Marland Kitchen BRIMONIDINE TARTRATE 0.1 % OP SOLN Both Eyes Place 1 drop into both eyes 2 (two) times daily. Daily at 8 am and 5 pm    . CALCIUM CITRATE-VITAMIN D 315-200 MG-UNIT PO TABS Oral Take 1 tablet by mouth daily.    Marland Kitchen CLINDAMYCIN HCL 150 MG PO CAPS Oral Take 2 capsules (300 mg total) by mouth 3 (three) times daily. 30 capsule 0  . CLOPIDOGREL BISULFATE 75 MG PO TABS Oral Take 75 mg by mouth every morning.     Marland Kitchen FLUTICASONE-SALMETEROL 100-50 MCG/DOSE IN AEPB Inhalation Inhale 1  puff into the lungs every 12 (twelve) hours.    Marland Kitchen GABAPENTIN 400 MG PO CAPS Oral Take 400 mg by mouth 3 (three) times daily.    Marland Kitchen GLIMEPIRIDE 2 MG PO TABS Oral Take 2 mg by mouth daily before breakfast.    . INSULIN GLARGINE 100 UNIT/ML [place] SOLN Subcutaneous Inject 43 Units into the skin at bedtime.     . ISOSORBIDE MONONITRATE ER 60 MG PO TB24 Oral Take 60 mg by mouth every morning.     Marland Kitchen LAMOTRIGINE 25 MG PO TABS Oral Take 25 mg by mouth 2 (two) times daily.    Marland Kitchen LATANOPROST 0.005 % OP SOLN Both Eyes Place 1 drop into both eyes at bedtime.    Marland Kitchen LORATADINE 10 MG PO TABS Oral Take 10 mg by mouth every morning.     Marland Kitchen METFORMIN HCL 1000 MG PO TABS Oral Take 1,000 mg by mouth 2 (two) times daily with a meal.    . METRONIDAZOLE 500 MG PO TABS Oral Take 500 mg by mouth 4 (four) times daily.    Marland Kitchen MONTELUKAST SODIUM 10 MG PO  TABS Oral Take 10 mg by mouth at bedtime.    Marland Kitchen PREDNISONE 10 MG PO TABS Oral Take 15 mg by mouth every morning.     Marland Kitchen ROSUVASTATIN CALCIUM 20 MG PO TABS Oral Take 20 mg by mouth every evening.     Marland Kitchen TRAMADOL HCL 50 MG PO TABS Oral Take 50-100 mg by mouth every 8 (eight) hours as needed. For pain    . VERAPAMIL HCL ER 240 MG PO CP24 Oral Take 240 mg by mouth at bedtime.    Marland Kitchen NITROGLYCERIN 0.4 MG SL SUBL Sublingual Place 0.4 mg under the tongue every 5 (five) minutes x 3 doses as needed. For chest pain. Do not exceed 3 tablets in 15 minutes      BP 137/74  Pulse 87  Temp 98.4 F (36.9 C) (Oral)  Resp 16  Ht 5\' 6"  (1.676 m)  Wt 198 lb (89.812 kg)  BMI 31.96 kg/m2  SpO2 98%  Physical Exam  Nursing note and vitals reviewed. Constitutional: She is oriented to person, place, and time. She appears well-developed and well-nourished.  Non-toxic appearance.  HENT:  Head: Normocephalic.  Right Ear: Tympanic membrane and external ear normal.  Left Ear: Tympanic membrane and external ear normal.  Eyes: EOM and lids are normal. Pupils are equal, round, and reactive to light.  Neck: Normal range of motion. Neck supple. Carotid bruit is not present.  Cardiovascular: Normal rate, regular rhythm, normal heart sounds, intact distal pulses and normal pulses.   Pulmonary/Chest: Breath sounds normal. No respiratory distress.  Abdominal: Soft. Bowel sounds are normal. There is no tenderness. There is no guarding.       The abscess of the abd wall is healing nicely. No red streaking. Minimal drainage noted.  Musculoskeletal: Normal range of motion.  Lymphadenopathy:       Head (right side): No submandibular adenopathy present.       Head (left side): No submandibular adenopathy present.    She has no cervical adenopathy.  Neurological: She is alert and oriented to person, place, and time. She has normal strength. No cranial nerve deficit or sensory deficit.  Skin: Skin is warm and dry.  Psychiatric: She  has a normal mood and affect. Her speech is normal.    ED Course  Procedures (including critical care time)  Labs Reviewed - No data to display No  results found.   1. Wound check, abscess       MDM  I have reviewed nursing notes, vital signs, and all appropriate lab and imaging results for this patient.  Abscess of the right abd wall is progressing nicely. Packing removed. Sterile dressing applied. Pt to finish the cleocin and return if any changes or prblem.      Kathie Dike, [place] 07/08/12 1036

## 2012-07-09 LAB — CULTURE, ROUTINE-ABSCESS

## 2012-07-10 NOTE — ED Notes (Signed)
+  Abscess. +MRSA. Patient treated with Clindamycin. Sensitive to same. Per protocol MD.

## 2012-07-31 ENCOUNTER — Encounter (HOSPITAL_COMMUNITY): Payer: Self-pay | Admitting: *Deleted

## 2012-07-31 ENCOUNTER — Emergency Department (HOSPITAL_COMMUNITY)
Admission: EM | Admit: 2012-07-31 | Discharge: 2012-07-31 | Disposition: A | Discharge status: Home / Self Care | Payer: Medicare Other | Attending: Emergency Medicine | Admitting: Emergency Medicine

## 2012-07-31 DIAGNOSIS — L02519 Cutaneous abscess of unspecified hand: Secondary | ICD-10-CM | POA: Insufficient documentation

## 2012-07-31 DIAGNOSIS — L03019 Cellulitis of unspecified finger: Secondary | ICD-10-CM | POA: Insufficient documentation

## 2012-07-31 DIAGNOSIS — L089 Local infection of the skin and subcutaneous tissue, unspecified: Secondary | ICD-10-CM

## 2012-07-31 MED ORDER — METRONIDAZOLE 500 MG PO TABS
250.0000 mg | ORAL_TABLET | Freq: Once | ORAL | Status: AC
  Administered 2012-07-31: 250 mg via ORAL
  Filled 2012-07-31: qty 2

## 2012-07-31 MED ORDER — CLINDAMYCIN HCL 150 MG PO CAPS: 150.0000 mg | ORAL_CAPSULE | Freq: Three times a day (TID) | ORAL | Status: AC

## 2012-07-31 MED ORDER — CLINDAMYCIN HCL 150 MG PO CAPS
300.0000 mg | ORAL_CAPSULE | Freq: Once | ORAL | Status: AC
  Administered 2012-07-31: 300 mg via ORAL
  Filled 2012-07-31: qty 2

## 2012-07-31 MED ORDER — ONDANSETRON HCL 4 MG PO TABS
4.0000 mg | ORAL_TABLET | Freq: Once | ORAL | Status: AC
  Administered 2012-07-31: 4 mg via ORAL
  Filled 2012-07-31: qty 1

## 2012-07-31 NOTE — ED Notes (Signed)
Pt presents with right index finger abscess x 1 week. Pt states it has started hurting more.  Pt denies fever at this time.

## 2012-07-31 NOTE — ED Notes (Signed)
Pt c/o pain and redness to end of right index finger.

## 2012-08-10 NOTE — ED Provider Notes (Signed)
History     CSN: [id number]  Arrival date & time 07/31/12  2118   First MD Initiated Contact with Patient 07/31/12 2223      Chief Complaint  Patient presents with  . Abscess    (Consider location/radiation/quality/duration/timing/severity/associated sxs/prior treatment) Patient is a 66 y.o. female presenting with abscess. The history is provided by the patient.  Abscess  This is a new problem. The current episode started less than one week ago. The problem occurs frequently. The problem has been gradually worsening. The abscess is present on the right hand (right index finger). The problem is moderate. The abscess is characterized by painfulness. It is unknown what she was exposed to. Pertinent negatives include no fever. There were no sick contacts.    Past Medical History  Diagnosis Date  . PMR (polymyalgia rheumatica)   . Giant cell arteritis   . Acute coronary syndrome   . Diabetes mellitus   . Glaucoma   . Pyelonephritis   . Asthma   . Dyspnea   . Chest pain   . Osteoarthritis   . Migraine   . Macular pucker   . C. difficile colitis     Past Surgical History  Procedure Date  . Knee surgery   . Cholecystectomy   . Appendectomy   . Finger surgery   . Foot surgery   . Coronary stent placement   . Skin biopsy     History reviewed. No pertinent family history.  History  Substance Use Topics  . Smoking status: Never Smoker   . Smokeless tobacco: Never Used  . Alcohol Use: No    OB History    Grav Para Term Preterm Abortions TAB SAB Ect Mult Living   3 2 2  1  1   2       Review of Systems  Constitutional: Negative for fever.  Eyes: Positive for pain.  Respiratory: Positive for wheezing.   Cardiovascular: Positive for chest pain.  Musculoskeletal: Positive for joint swelling and arthralgias.  Psychiatric/Behavioral: Positive for disturbed wake/sleep cycle.    Allergies  Food; Iodine; Motrin; Ambien; Betadine; Carisoprodol; Celebrex; Enalapril;  Hctz; Lasix; Maxidex; Maxzide; and Theophyllines  Home Medications   Current Outpatient Rx  Name Route Sig Dispense Refill  . ACETAMINOPHEN 500 MG PO TABS Oral Take 1,000 mg by mouth every 6 (six) hours as needed. FOR PAIN    . ACETAMINOPHEN-CODEINE #3 300-30 MG PO TABS Oral Take 1 tablet by mouth 3 (three) times daily as needed. For pain    . ALBUTEROL SULFATE HFA 108 (90 BASE) MCG/ACT IN AERS Inhalation Inhale 2 puffs into the lungs every 6 (six) hours as needed. For shortness of breath/wheezing    . ASPIRIN EC 81 MG PO TBEC Oral Take 81 mg by mouth at bedtime.     Marland Kitchen BRIMONIDINE TARTRATE 0.1 % OP SOLN Both Eyes Place 1 drop into both eyes 2 (two) times daily. Daily at 8 am and 5 pm    . CALCIUM CITRATE-VITAMIN D 315-200 MG-UNIT PO TABS Oral Take 1 tablet by mouth daily.    Marland Kitchen CLINDAMYCIN HCL 150 MG PO CAPS Oral Take 1 capsule (150 mg total) by mouth 3 (three) times daily. 21 capsule 0  . CLOPIDOGREL BISULFATE 75 MG PO TABS Oral Take 75 mg by mouth every morning.     Marland Kitchen FLUTICASONE-SALMETEROL 100-50 MCG/DOSE IN AEPB Inhalation Inhale 1 puff into the lungs every 12 (twelve) hours.    Marland Kitchen GABAPENTIN 400 MG PO CAPS Oral Take  400 mg by mouth 3 (three) times daily.    Marland Kitchen GLIMEPIRIDE 2 MG PO TABS Oral Take 2 mg by mouth daily before breakfast.    . INSULIN GLARGINE 100 UNIT/ML [place] SOLN Subcutaneous Inject 43 Units into the skin at bedtime.     . ISOSORBIDE MONONITRATE ER 60 MG PO TB24 Oral Take 60 mg by mouth every morning.     Marland Kitchen LAMOTRIGINE 25 MG PO TABS Oral Take 25 mg by mouth 2 (two) times daily.    Marland Kitchen LATANOPROST 0.005 % OP SOLN Both Eyes Place 1 drop into both eyes at bedtime.    Marland Kitchen LORATADINE 10 MG PO TABS Oral Take 10 mg by mouth every morning.     Marland Kitchen METFORMIN HCL 1000 MG PO TABS Oral Take 1,000 mg by mouth 2 (two) times daily with a meal.    . MONTELUKAST SODIUM 10 MG PO TABS Oral Take 10 mg by mouth at bedtime.    Marland Kitchen NITROGLYCERIN 0.4 MG SL SUBL Sublingual Place 0.4 mg under the tongue every 5  (five) minutes x 3 doses as needed. For chest pain. Do not exceed 3 tablets in 15 minutes    . PREDNISONE 10 MG PO TABS Oral Take 15 mg by mouth every morning.     Marland Kitchen ROSUVASTATIN CALCIUM 20 MG PO TABS Oral Take 20 mg by mouth every evening.     Marland Kitchen TRAMADOL HCL 50 MG PO TABS Oral Take 50-100 mg by mouth every 8 (eight) hours as needed. For pain    . VERAPAMIL HCL ER 240 MG PO CP24 Oral Take 240 mg by mouth at bedtime.      BP 156/93  Pulse 104  Temp 97.4 F (36.3 C) (Oral)  Resp 20  Ht 5' 6.5" (1.689 m)  Wt 198 lb (89.812 kg)  BMI 31.48 kg/m2  SpO2 98%  Physical Exam  Nursing note and vitals reviewed. Constitutional: She is oriented to person, place, and time. She appears well-developed and well-nourished.  Non-toxic appearance.  HENT:  Head: Normocephalic.  Right Ear: Tympanic membrane and external ear normal.  Left Ear: Tympanic membrane and external ear normal.  Eyes: EOM and lids are normal. Pupils are equal, round, and reactive to light.  Neck: Normal range of motion. Neck supple. Carotid bruit is not present.  Cardiovascular: Normal rate, regular rhythm, normal heart sounds, intact distal pulses and normal pulses.   Pulmonary/Chest: Breath sounds normal. No respiratory distress.  Abdominal: Soft. Bowel sounds are normal. There is no tenderness. There is no guarding.  Musculoskeletal: Normal range of motion.       There is redness and tenderness of the right index finger. No red streaking. Minimal drainage noted. FROM of the finger.   Lymphadenopathy:       Head (right side): No submandibular adenopathy present.       Head (left side): No submandibular adenopathy present.    She has no cervical adenopathy.  Neurological: She is alert and oriented to person, place, and time. She has normal strength. No cranial nerve deficit or sensory deficit.  Skin: Skin is warm and dry.  Psychiatric: She has a normal mood and affect. Her speech is normal.    ED Course  Procedures  (including critical care time)  Labs Reviewed - No data to display No results found.   1. Finger infection       MDM  I have reviewed nursing notes, vital signs, and all appropriate lab and imaging results for this patient.  Pt to use warm soaks to the finger. Use antibiotic as suggested. Tylenol #3 for pain. Pt to return if any changes.        Kathie Dike, [place] 08/19/12 [phone]

## 2012-08-20 NOTE — ED Provider Notes (Signed)
Medical screening examination/treatment/procedure(s) were performed by non-physician practitioner and as supervising physician I was immediately available for consultation/collaboration.  Shelda Jakes, MD 08/20/12 [phone]

## 2012-08-24 IMAGING — CR DG FOOT COMPLETE 3+V*R*
3 series · 3 of 3 positions shown · non-contrast
Comparison: None.

CLINICAL DATA: Fifth metatarsal pain

RIGHT FOOT COMPLETE - 3+ VIEW

[view not recorded (1 of 3)]
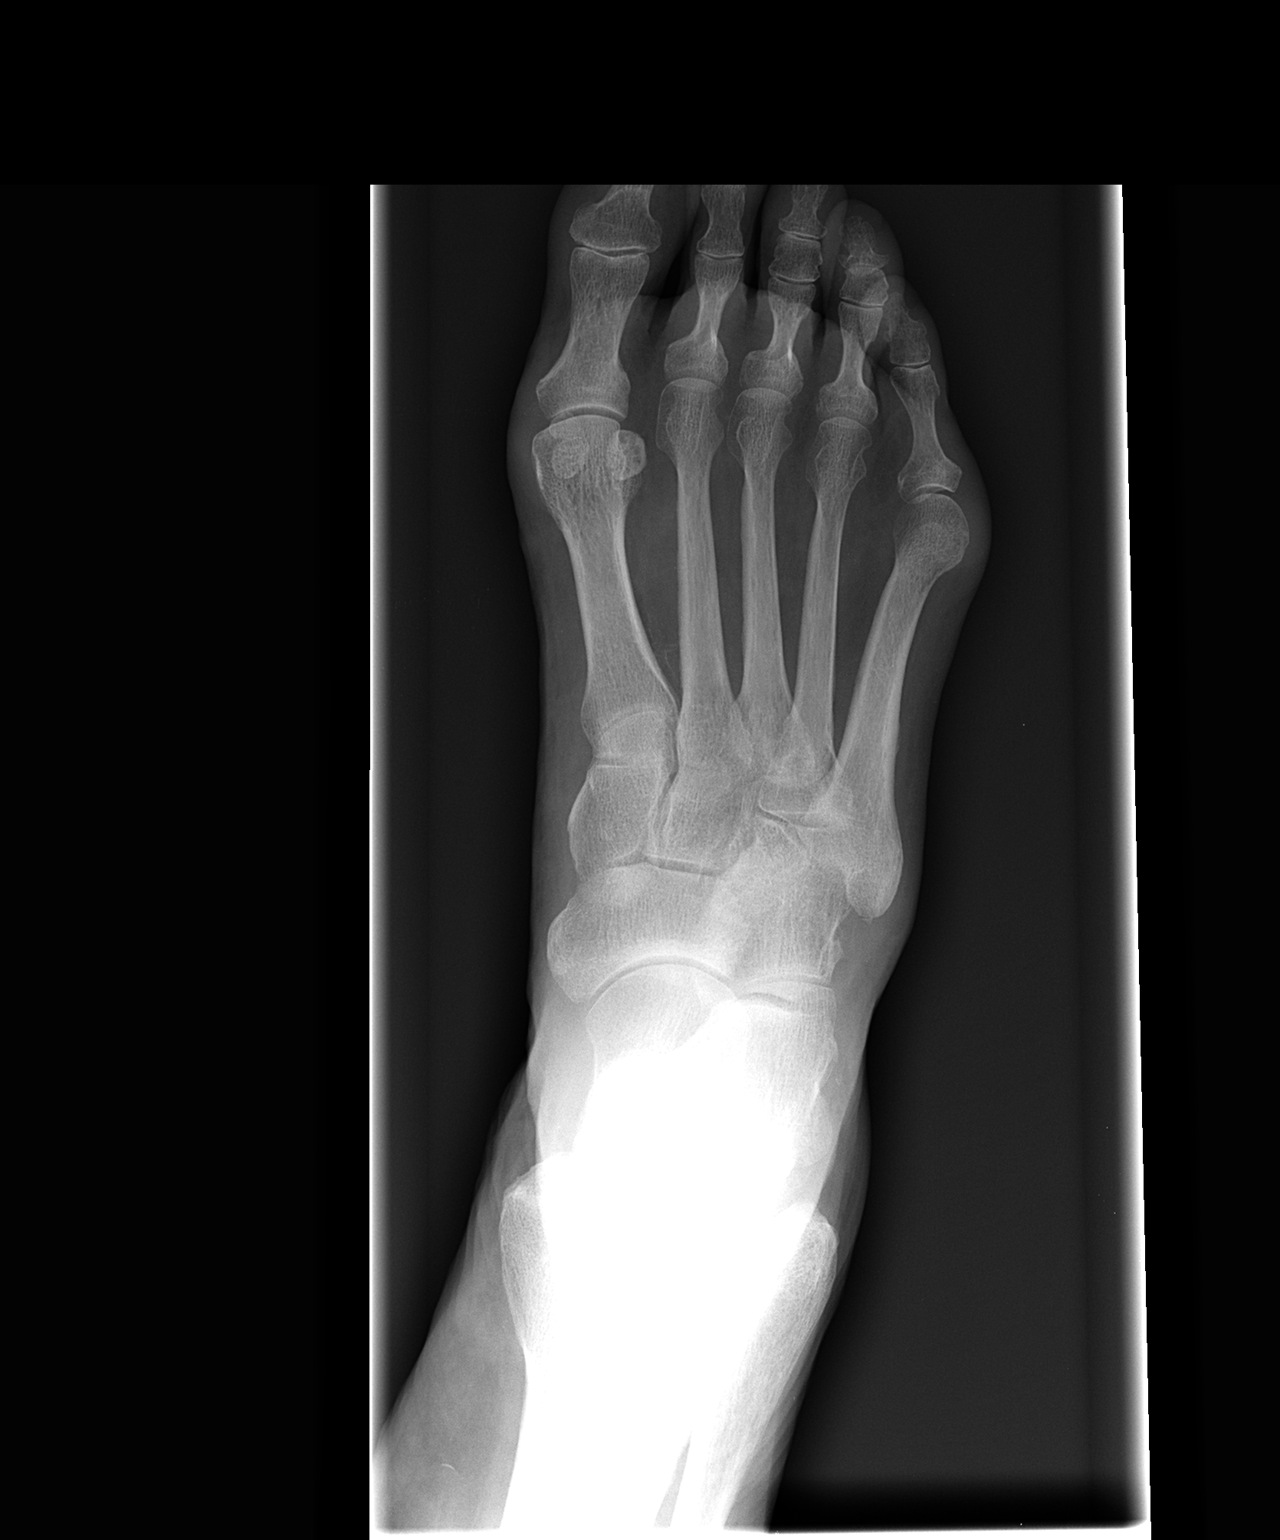

[view not recorded (2 of 3)]
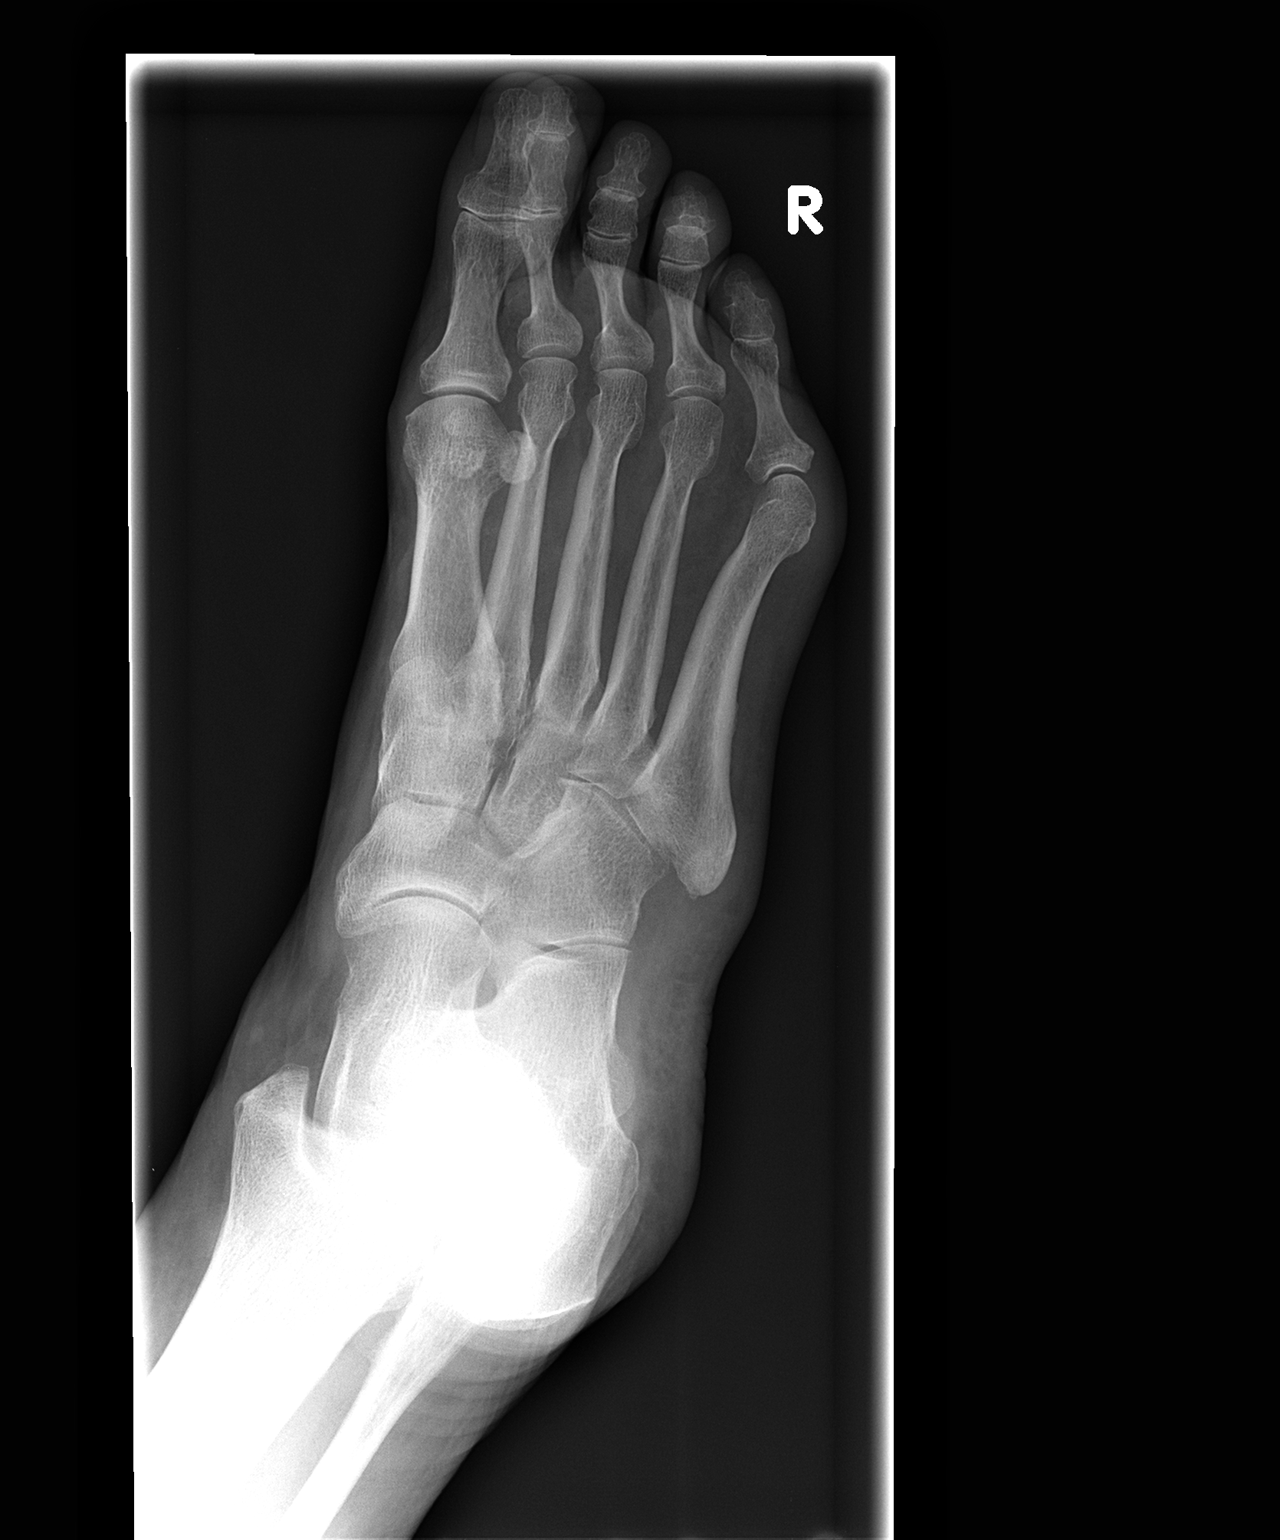

[view not recorded (3 of 3)]
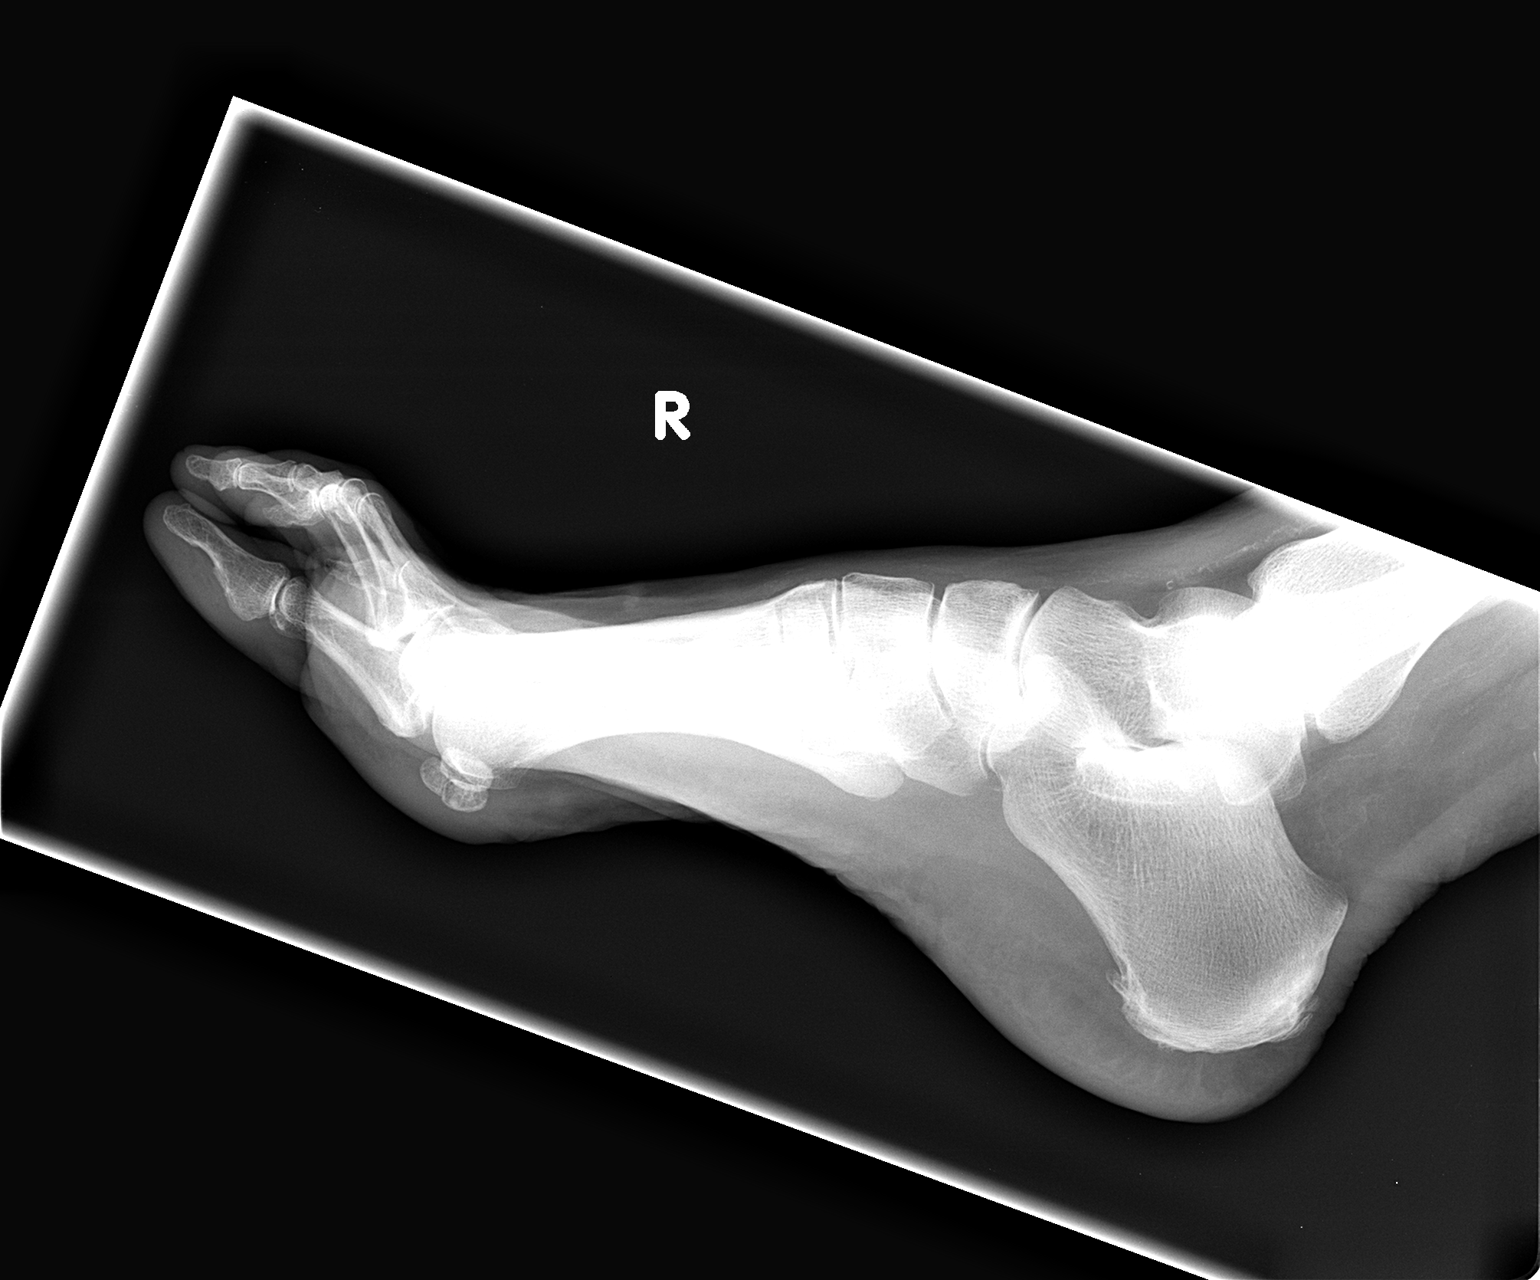

[3 of 3 positions shown; findings below may reference images not displayed]

FINDINGS: No fracture evident.  Metatarsals appear intact.  Slight
medial deviation of the right fifth MTP joint noted.  This appears
to be chronic.  No radiopaque foreign body.

Peripheral vascular calcifications noted on the lateral view
IMPRESSION: No acute osseous finding.

## 2013-10-30 ENCOUNTER — Encounter (HOSPITAL_COMMUNITY): Payer: Self-pay | Admitting: Emergency Medicine

## 2013-10-30 ENCOUNTER — Emergency Department (HOSPITAL_COMMUNITY)
Admission: EM | Admit: 2013-10-30 | Discharge: 2013-10-30 | Disposition: A | Discharge status: Home / Self Care | Payer: Medicare Other | Attending: Emergency Medicine | Admitting: Emergency Medicine

## 2013-10-30 ENCOUNTER — Emergency Department (HOSPITAL_COMMUNITY): Payer: Medicare Other

## 2013-10-30 DIAGNOSIS — Z8742 Personal history of other diseases of the female genital tract: Secondary | ICD-10-CM | POA: Insufficient documentation

## 2013-10-30 DIAGNOSIS — Z8679 Personal history of other diseases of the circulatory system: Secondary | ICD-10-CM | POA: Insufficient documentation

## 2013-10-30 DIAGNOSIS — Z7982 Long term (current) use of aspirin: Secondary | ICD-10-CM | POA: Insufficient documentation

## 2013-10-30 DIAGNOSIS — Z9861 Coronary angioplasty status: Secondary | ICD-10-CM | POA: Insufficient documentation

## 2013-10-30 DIAGNOSIS — IMO0002 Reserved for concepts with insufficient information to code with codable children: Secondary | ICD-10-CM | POA: Insufficient documentation

## 2013-10-30 DIAGNOSIS — Z7902 Long term (current) use of antithrombotics/antiplatelets: Secondary | ICD-10-CM | POA: Insufficient documentation

## 2013-10-30 DIAGNOSIS — M1990 Unspecified osteoarthritis, unspecified site: Secondary | ICD-10-CM | POA: Insufficient documentation

## 2013-10-30 DIAGNOSIS — G43909 Migraine, unspecified, not intractable, without status migrainosus: Secondary | ICD-10-CM | POA: Insufficient documentation

## 2013-10-30 DIAGNOSIS — R609 Edema, unspecified: Secondary | ICD-10-CM | POA: Insufficient documentation

## 2013-10-30 DIAGNOSIS — R202 Paresthesia of skin: Secondary | ICD-10-CM

## 2013-10-30 DIAGNOSIS — E119 Type 2 diabetes mellitus without complications: Secondary | ICD-10-CM | POA: Insufficient documentation

## 2013-10-30 DIAGNOSIS — Z794 Long term (current) use of insulin: Secondary | ICD-10-CM | POA: Insufficient documentation

## 2013-10-30 DIAGNOSIS — Z8619 Personal history of other infectious and parasitic diseases: Secondary | ICD-10-CM | POA: Insufficient documentation

## 2013-10-30 DIAGNOSIS — Z79899 Other long term (current) drug therapy: Secondary | ICD-10-CM | POA: Insufficient documentation

## 2013-10-30 DIAGNOSIS — R209 Unspecified disturbances of skin sensation: Secondary | ICD-10-CM | POA: Insufficient documentation

## 2013-10-30 DIAGNOSIS — R42 Dizziness and giddiness: Secondary | ICD-10-CM | POA: Insufficient documentation

## 2013-10-30 DIAGNOSIS — H539 Unspecified visual disturbance: Secondary | ICD-10-CM | POA: Insufficient documentation

## 2013-10-30 DIAGNOSIS — R0602 Shortness of breath: Secondary | ICD-10-CM | POA: Insufficient documentation

## 2013-10-30 DIAGNOSIS — J45909 Unspecified asthma, uncomplicated: Secondary | ICD-10-CM | POA: Insufficient documentation

## 2013-10-30 HISTORY — DX: Bronchitis, not specified as acute or chronic: J40

## 2013-10-30 LAB — URINALYSIS, ROUTINE W REFLEX MICROSCOPIC
Bilirubin Urine: NEGATIVE
Glucose, UA: NEGATIVE mg/dL
Hgb urine dipstick: NEGATIVE
Specific Gravity, Urine: 1.025 (ref 1.005–1.030)
pH: 6 (ref 5.0–8.0)

## 2013-10-30 LAB — BASIC METABOLIC PANEL
BUN: 14 mg/dL (ref 6–23)
Chloride: 102 mEq/L (ref 96–112)
Creatinine, Ser: 0.73 mg/dL (ref 0.50–1.10)
GFR calc Af Amer: >90 mL/min (ref >90)
Glucose, Bld: 186 mg/dL — ABNORMAL HIGH (ref 70–99)
Potassium: 4.1 mEq/L (ref 3.5–5.1)

## 2013-10-30 LAB — CBC
HCT: 44.1 % (ref 36.0–46.0)
Hemoglobin: 14.2 g/dL (ref 12.0–15.0)
MCV: 96.1 fL (ref 78.0–100.0)
RDW: 16.9 % — ABNORMAL HIGH (ref 11.5–15.5)
WBC: 11 10*3/uL — ABNORMAL HIGH (ref 4.0–10.5)

## 2013-10-30 MED ORDER — TORSEMIDE 10 MG PO TABS: 10.0000 mg | ORAL_TABLET | Freq: Every day | ORAL | Status: DC

## 2013-10-30 NOTE — ED Provider Notes (Signed)
CSN: [id number]     Arrival date & time 10/30/13  1111 History  This chart was scribed for Flint Melter, MD by Bennett Scrape, ED Scribe. This patient was seen in room [facility]12/[facility]12 and the patient's care was started at 1:34 PM.     CC: Hand Paresthesias   The history is provided by the patient. No language interpreter was used.    HPI Comments: Kelli Stewart is a 67 y.o. female who presents to the Emergency Department complaining of intermittent, non-changing paresthesias described as tingling to bilateral hands for the past day with associated bilateral lower leg swelling for the past week. She reports associated SOB with exertion and increased dizziness as well. She states that she was diagnosed with acute bronchitis 3 weeks ago. She was started on 60 mg of prednisone for one week and finished the prescription one week ago with improvement. She denies any increased breathing difficulty since and denies that the SOB is similar to the bronchitis symptoms. For the dizziness, she reports taking Meclizine, last dose around 5 AM, with improvement. She states that within the past week she has had to take the medication daily for seemingly ongoing dizziness. She also expresses concern over an increase in her baseline weight of 207 to 213 3 days ago. Weight is 208 in the ED. She denies being on any fluid pills currently. She denies having a h/o CHF or CVA. She reports that she has been eating and drinking normally since the onset of the leg swelling one week ago and admits that she is hungry and thirsty currently. She denies any fevers, emesis or cough.   She also reports bilateral cataract surgery 6 weeks ago. She states that her vision appears to be "swimmy" at times. She admits that she was told by her ophthalmologist that her eyes might be dry after the surgery which could cause the swimminess.     Past Medical History  Diagnosis Date  . PMR (polymyalgia rheumatica)   . Giant cell arteritis   .  Acute coronary syndrome   . Diabetes mellitus   . Glaucoma   . Asthma   . Dyspnea   . Chest pain   . Osteoarthritis   . Migraine   . Macular pucker   . C. difficile colitis   . Pyelonephritis   . Bronchitis    Past Surgical History  Procedure Laterality Date  . Knee surgery    . Cholecystectomy    . Appendectomy    . Finger surgery    . Foot surgery    . Coronary stent placement    . Skin biopsy    . Eye surgery     History reviewed. No pertinent family history. History  Substance Use Topics  . Smoking status: Never Smoker   . Smokeless tobacco: Never Used  . Alcohol Use: No   OB History   Grav Para Term Preterm Abortions TAB SAB Ect Mult Living   3 2 2  1  1   2      Review of Systems  Constitutional: Negative for fever, chills and appetite change.  Eyes: Positive for visual disturbance.  Respiratory: Positive for shortness of breath. Negative for cough.   Cardiovascular: Positive for leg swelling. Negative for chest pain.  Neurological: Positive for dizziness and numbness. Negative for weakness.  All other systems reviewed and are negative.    Allergies  Food; Iodine; Motrin; Ambien; Betadine; Carisoprodol; Celebrex; Enalapril; Hctz; Lasix-"kidney pain" per pt; Maxidex; Maxzide; and  Theophyllines  Home Medications   Current Outpatient Rx  Name  Route  Sig  Dispense  Refill  . acetaminophen (TYLENOL) 650 MG CR tablet   Oral   Take 650 mg by mouth every 8 (eight) hours as needed for pain.         Marland Kitchen acetaminophen-codeine (TYLENOL #3) 300-30 MG per tablet   Oral   Take 1 tablet by mouth 3 (three) times daily as needed. For pain         . albuterol (PROVENTIL HFA;VENTOLIN HFA) 108 (90 BASE) MCG/ACT inhaler   Inhalation   Inhale 2 puffs into the lungs every 6 (six) hours as needed. For shortness of breath/wheezing         . alendronate (FOSAMAX) 70 MG tablet   Oral   Take 70 mg by mouth once a week. Take with a full glass of water on an empty  stomach.         Marland Kitchen aspirin EC 81 MG tablet   Oral   Take 81 mg by mouth at bedtime.          . brimonidine (ALPHAGAN P) 0.1 % SOLN   Both Eyes   Place 1 drop into both eyes 2 (two) times daily. Daily at 8 am and 5 pm         . calcium citrate-vitamin D (CITRACAL+D) 315-200 MG-UNIT per tablet   Oral   Take 1 tablet by mouth daily.         . clopidogrel (PLAVIX) 75 MG tablet   Oral   Take 75 mg by mouth every morning.          . fluorometholone (FML S.O.P.) 0.1 % ophthalmic ointment   Both Eyes   Place 1 application into both eyes 4 (four) times daily.         . Fluticasone-Salmeterol (ADVAIR) 100-50 MCG/DOSE AEPB   Inhalation   Inhale 1 puff into the lungs every 12 (twelve) hours.         . gabapentin (NEURONTIN) 600 MG tablet   Oral   Take 600 mg by mouth 3 (three) times daily.         Marland Kitchen glimepiride (AMARYL) 2 MG tablet   Oral   Take 2 mg by mouth daily before breakfast.         . insulin glargine (LANTUS) 100 UNIT/ML injection   Subcutaneous   Inject 43 Units into the skin daily.          . insulin regular (NOVOLIN R,HUMULIN R) 100 units/mL injection   Subcutaneous   Inject 5-10 Units into the skin 3 (three) times daily before meals. Based on sliding scale         . isosorbide mononitrate (IMDUR) 60 MG 24 hr tablet   Oral   Take 60 mg by mouth every morning.          . lamoTRIgine (LAMICTAL) 25 MG tablet   Oral   Take 25 mg by mouth 2 (two) times daily.         Marland Kitchen latanoprost (XALATAN) 0.005 % ophthalmic solution   Both Eyes   Place 1 drop into both eyes at bedtime.         Marland Kitchen loratadine (CLARITIN) 10 MG tablet   Oral   Take 10 mg by mouth every morning.          . meclizine (ANTIVERT) 12.5 MG tablet   Oral   Take 12.5 mg by mouth 3 (three) times  daily as needed for dizziness.         . metFORMIN (GLUCOPHAGE) 1000 MG tablet   Oral   Take 1,000 mg by mouth 2 (two) times daily with a meal.         . montelukast  (SINGULAIR) 10 MG tablet   Oral   Take 10 mg by mouth at bedtime.         . Multiple Vitamins-Minerals (MULTIVITAMINS THER. W/MINERALS) TABS tablet   Oral   Take 1 tablet by mouth daily.         . nitroGLYCERIN (NITROSTAT) 0.4 MG SL tablet   Sublingual   Place 0.4 mg under the tongue every 5 (five) minutes x 3 doses as needed. For chest pain. Do not exceed 3 tablets in 15 minutes         . predniSONE (DELTASONE) 10 MG tablet   Oral   Take 10 mg by mouth every morning.          . promethazine (PHENERGAN) 25 MG tablet   Oral   Take 25 mg by mouth every 8 (eight) hours as needed for nausea or vomiting.         . simvastatin (ZOCOR) 20 MG tablet   Oral   Take 20 mg by mouth daily.         . verapamil (VERELAN PM) 240 MG 24 hr capsule   Oral   Take 240 mg by mouth at bedtime.         . torsemide (DEMADEX) 10 MG tablet   Oral   Take 1 tablet (10 mg total) by mouth daily.   30 tablet   0    Triage Vitals: BP 143/95  Pulse 115  Temp(Src) 98.1 F (36.7 C) (Oral)  Resp 20  Ht 5\' 7"  (1.702 m)  Wt 210 lb (95.255 kg)  BMI 32.88 kg/m2  SpO2 100%  Physical Exam  Nursing note and vitals reviewed. Constitutional: She is oriented to person, place, and time. She appears well-developed and well-nourished.  HENT:  Head: Normocephalic and atraumatic.  Eyes: Conjunctivae and EOM are normal. Pupils are equal, round, and reactive to light.  Neck: Normal range of motion and phonation normal. Neck supple.  Cardiovascular: Normal rate, regular rhythm and intact distal pulses.   Pulmonary/Chest: Effort normal and breath sounds normal. She exhibits no tenderness.  Abdominal: Soft. She exhibits no distension. There is no tenderness. There is no guarding.  Musculoskeletal: Normal range of motion. She exhibits edema (3+ pitting edema to bilateral lower extremities ) and tenderness (diffuse bilateral lower extremity tenderness).  Neurological: She is alert and oriented to person,  place, and time. She exhibits normal muscle tone.  No ataxia   Skin: Skin is warm and dry.  Psychiatric: She has a normal mood and affect. Her behavior is normal. Judgment and thought content normal.    ED Course  Procedures (including critical care time)  Medications - No data to display  Patient Vitals for the past 24 hrs:  BP Temp Temp src Pulse Resp SpO2 Height Weight  10/30/13 1555 162/80 mmHg - - 112 18 98 % - -  10/30/13 1344 - - - - - - - 208 lb 7 oz (94.547 kg)  10/30/13 1119 143/95 mmHg 98.1 F (36.7 C) Oral 115 20 100 % 5\' 7"  (1.702 m) 210 lb (95.255 kg)   DIAGNOSTIC STUDIES: Oxygen Saturation is 100% on room air, normal by my interpretation.    COORDINATION OF CARE: 1:41 PM-Pt  is requesting food and something to drink. Will provide pt with food and something to drink. Discussed treatment plan which includes CXR, CBC panel and BMP with pt at bedside and pt agreed to plan  Nursing Notes Reviewed/ Care Coordinated Applicable Imaging Reviewed Interpretation of Laboratory Data incorporated into ED treatment.  Radiologic imaging report reviewed and images by radiolgy  - viewed, by me.  Labs Review Labs Reviewed  CBC - Abnormal; Notable for the following:    WBC 11.0 (*)    RDW 16.9 (*)    All other components within normal limits  BASIC METABOLIC PANEL - Abnormal; Notable for the following:    Glucose, Bld 186 (*)    GFR calc non Af Amer 86 (*)    All other components within normal limits  URINALYSIS, ROUTINE W REFLEX MICROSCOPIC - Abnormal; Notable for the following:    APPearance HAZY (*)    All other components within normal limits  PRO B NATRIURETIC PEPTIDE   Imaging Review Dg Chest 1 View  10/30/2013   CLINICAL DATA:  Possible nodular density at left apex by chest x-ray earlier today.  EXAM: CHEST - 1 VIEW  COMPARISON:  Two-view chest x-ray earlier today.  FINDINGS: AP lordotic view shows no further concerning density at the left apex. Lungs show no focal  consolidation or edema.  IMPRESSION: No suspicious nodule is identified at the left apex on the lordotic view.   Electronically Signed   By: Irish Lack M.D.   On: 10/30/2013 15:09   Dg Chest 2 View  10/30/2013   CLINICAL DATA:  Dyspnea on exertion  EXAM: CHEST  2 VIEW  COMPARISON:  09/13/2008  FINDINGS: Cardiac shadow is within normal limits. The lungs are well aerated bilaterally. No focal infiltrate or sizable effusion is seen. There is a questionable nodular density overlying the left lung apex. A lordotic view of the chest may be helpful for further evaluation. Degenerative change of thoracic spine is seen.  IMPRESSION: Questionable nodular density in the left apex. Lordotic view of the chest is recommended for further evaluation.   Electronically Signed   By: Alcide Clever M.D.   On: 10/30/2013 14:14    EKG Interpretation   None       MDM   1. Peripheral edema   2. Paresthesia    Peripheral edema with negative evaluation for congestive heart failure, pulmonary effusion, pneumonia or instability. She has paresthesias in the upper extremities of a nonspecific nature and they may be associated with her diabetes. There is no evidence for CVA or intracranial abnormality.  She's not currently taking a diuretic. She is stable for discharge.  Nursing Notes Reviewed/ Care Coordinated, and agree without changes. Applicable Imaging Reviewed.  Interpretation of Laboratory Data incorporated into ED treatment  Plan: Home Medications-torsemide; Home Treatments- take torsemide as prescribed; return here if the recommended treatment, does not improve the symptoms; Recommended follow up-with PCP  I personally performed the services described in this documentation, which was scribed in my presence. The recorded information has been reviewed and is accurate.     Flint Melter, MD 10/30/13 2159

## 2013-10-30 NOTE — ED Notes (Signed)
Swelling of legs and feet for over 1 week, tingling both hands for 1 day,

## 2013-10-30 NOTE — ED Notes (Signed)
Lasix is on allergy list but pt states the side effect was related to her medical condition rather than the medicine per her PCP.

## 2013-10-30 NOTE — ED Notes (Signed)
Pt has bilateral+4 pitting edema to legs and feet, pt does take 10mg  prednisone per day, states her "legs were swollen so bad yesterday that it hurt to walk", pt states swelling is not affected by elevating her feet.  Bilateral lung fields clear upon auscultation.  Legs are not red or hot, just very swollen.  Pt has had to take lasix in the past but currently not prescribed.

## 2013-10-30 NOTE — ED Notes (Signed)
Pt alert & oriented x4, stable gait. Patient given discharge instructions, paperwork & prescription(s). Patient  instructed to stop at the registration desk to finish any additional paperwork. Patient verbalized understanding. Pt left department w/ no further questions. 

## 2014-05-11 ENCOUNTER — Emergency Department (HOSPITAL_COMMUNITY): Payer: Medicare Other

## 2014-05-11 ENCOUNTER — Encounter (HOSPITAL_COMMUNITY): Payer: Self-pay | Admitting: Emergency Medicine

## 2014-05-11 ENCOUNTER — Inpatient Hospital Stay (HOSPITAL_COMMUNITY)
Admission: EM | Admit: 2014-05-11 | Discharge: 2014-05-15 | DRG: 644 | Disposition: A | Discharge status: Home / Self Care | Payer: Medicare Other | Attending: Internal Medicine | Admitting: Internal Medicine

## 2014-05-11 DIAGNOSIS — K3189 Other diseases of stomach and duodenum: Secondary | ICD-10-CM | POA: Diagnosis present

## 2014-05-11 DIAGNOSIS — J45901 Unspecified asthma with (acute) exacerbation: Secondary | ICD-10-CM | POA: Diagnosis present

## 2014-05-11 DIAGNOSIS — IMO0002 Reserved for concepts with insufficient information to code with codable children: Secondary | ICD-10-CM

## 2014-05-11 DIAGNOSIS — B370 Candidal stomatitis: Secondary | ICD-10-CM | POA: Diagnosis present

## 2014-05-11 DIAGNOSIS — R112 Nausea with vomiting, unspecified: Secondary | ICD-10-CM

## 2014-05-11 DIAGNOSIS — E669 Obesity, unspecified: Secondary | ICD-10-CM | POA: Diagnosis present

## 2014-05-11 DIAGNOSIS — E2740 Unspecified adrenocortical insufficiency: Secondary | ICD-10-CM

## 2014-05-11 DIAGNOSIS — R1310 Dysphagia, unspecified: Secondary | ICD-10-CM | POA: Diagnosis present

## 2014-05-11 DIAGNOSIS — R197 Diarrhea, unspecified: Secondary | ICD-10-CM

## 2014-05-11 DIAGNOSIS — R1110 Vomiting, unspecified: Secondary | ICD-10-CM | POA: Diagnosis present

## 2014-05-11 DIAGNOSIS — Z79899 Other long term (current) drug therapy: Secondary | ICD-10-CM

## 2014-05-11 DIAGNOSIS — E876 Hypokalemia: Secondary | ICD-10-CM

## 2014-05-11 DIAGNOSIS — H409 Unspecified glaucoma: Secondary | ICD-10-CM | POA: Diagnosis present

## 2014-05-11 DIAGNOSIS — J40 Bronchitis, not specified as acute or chronic: Secondary | ICD-10-CM

## 2014-05-11 DIAGNOSIS — T380X5A Adverse effect of glucocorticoids and synthetic analogues, initial encounter: Secondary | ICD-10-CM | POA: Diagnosis present

## 2014-05-11 DIAGNOSIS — R1013 Epigastric pain: Secondary | ICD-10-CM

## 2014-05-11 DIAGNOSIS — Z7982 Long term (current) use of aspirin: Secondary | ICD-10-CM

## 2014-05-11 DIAGNOSIS — D72829 Elevated white blood cell count, unspecified: Secondary | ICD-10-CM | POA: Diagnosis present

## 2014-05-11 DIAGNOSIS — M1990 Unspecified osteoarthritis, unspecified site: Secondary | ICD-10-CM | POA: Diagnosis present

## 2014-05-11 DIAGNOSIS — J45909 Unspecified asthma, uncomplicated: Secondary | ICD-10-CM

## 2014-05-11 DIAGNOSIS — E119 Type 2 diabetes mellitus without complications: Secondary | ICD-10-CM | POA: Diagnosis present

## 2014-05-11 DIAGNOSIS — E2749 Other adrenocortical insufficiency: Principal | ICD-10-CM | POA: Diagnosis present

## 2014-05-11 DIAGNOSIS — E860 Dehydration: Secondary | ICD-10-CM | POA: Diagnosis present

## 2014-05-11 DIAGNOSIS — E1169 Type 2 diabetes mellitus with other specified complication: Secondary | ICD-10-CM | POA: Diagnosis present

## 2014-05-11 DIAGNOSIS — Z6830 Body mass index (BMI) 30.0-30.9, adult: Secondary | ICD-10-CM

## 2014-05-11 DIAGNOSIS — E162 Hypoglycemia, unspecified: Secondary | ICD-10-CM

## 2014-05-11 DIAGNOSIS — I2510 Atherosclerotic heart disease of native coronary artery without angina pectoris: Secondary | ICD-10-CM | POA: Diagnosis present

## 2014-05-11 DIAGNOSIS — K2960 Other gastritis without bleeding: Secondary | ICD-10-CM | POA: Diagnosis present

## 2014-05-11 DIAGNOSIS — Z9861 Coronary angioplasty status: Secondary | ICD-10-CM

## 2014-05-11 DIAGNOSIS — I10 Essential (primary) hypertension: Secondary | ICD-10-CM | POA: Diagnosis present

## 2014-05-11 DIAGNOSIS — Z794 Long term (current) use of insulin: Secondary | ICD-10-CM

## 2014-05-11 DIAGNOSIS — M353 Polymyalgia rheumatica: Secondary | ICD-10-CM | POA: Diagnosis present

## 2014-05-11 DIAGNOSIS — J9801 Acute bronchospasm: Secondary | ICD-10-CM

## 2014-05-11 HISTORY — DX: Atherosclerotic heart disease of native coronary artery without angina pectoris: I25.10

## 2014-05-11 HISTORY — DX: Dysphagia, unspecified: R13.10

## 2014-05-11 LAB — CBC WITH DIFFERENTIAL/PLATELET
Basophils Absolute: 0 10*3/uL (ref 0.0–0.1)
Basophils Relative: 0 % (ref 0–1)
EOS ABS: 0 10*3/uL (ref 0.0–0.7)
Eosinophils Relative: 0 % (ref 0–5)
HCT: 41.8 % (ref 36.0–46.0)
Hemoglobin: 14.1 g/dL (ref 12.0–15.0)
Lymphocytes Relative: 19 % (ref 12–46)
Lymphs Abs: 1.8 10*3/uL (ref 0.7–4.0)
MCH: 32 pg (ref 26.0–34.0)
MCHC: 33.7 g/dL (ref 30.0–36.0)
MCV: 95 fL (ref 78.0–100.0)
MONOS PCT: 10 % (ref 3–12)
Monocytes Absolute: 0.9 10*3/uL (ref 0.1–1.0)
Neutro Abs: 6.5 10*3/uL (ref 1.7–7.7)
Neutrophils Relative %: 71 % (ref 43–77)
Platelets: 276 10*3/uL (ref 150–400)
RBC: 4.4 MIL/uL (ref 3.87–5.11)
RDW: 16 % — ABNORMAL HIGH (ref 11.5–15.5)
WBC: 9.2 10*3/uL (ref 4.0–10.5)

## 2014-05-11 LAB — URINALYSIS, ROUTINE W REFLEX MICROSCOPIC
BILIRUBIN URINE: NEGATIVE
Glucose, UA: NEGATIVE mg/dL
HGB URINE DIPSTICK: NEGATIVE
KETONES UR: NEGATIVE mg/dL
Nitrite: NEGATIVE
PH: 6 (ref 5.0–8.0)
PROTEIN: NEGATIVE mg/dL
Specific Gravity, Urine: <1.005 — ABNORMAL LOW (ref 1.005–1.030)
Urobilinogen, UA: 0.2 mg/dL (ref 0.0–1.0)

## 2014-05-11 LAB — URINE MICROSCOPIC-ADD ON

## 2014-05-11 LAB — CBG MONITORING, ED: Glucose-Capillary: 126 mg/dL — ABNORMAL HIGH (ref 70–99)

## 2014-05-11 LAB — COMPREHENSIVE METABOLIC PANEL
ALK PHOS: 65 U/L (ref 39–117)
ALT: 26 U/L (ref 0–35)
AST: 37 U/L (ref 0–37)
Albumin: 3.1 g/dL — ABNORMAL LOW (ref 3.5–5.2)
BUN: 13 mg/dL (ref 6–23)
CO2: 30 mEq/L (ref 19–32)
Calcium: 9 mg/dL (ref 8.4–10.5)
Chloride: 95 mEq/L — ABNORMAL LOW (ref 96–112)
Creatinine, Ser: 0.82 mg/dL (ref 0.50–1.10)
GFR, EST AFRICAN AMERICAN: 83 mL/min — AB (ref >90)
GFR, EST NON AFRICAN AMERICAN: 72 mL/min — AB (ref >90)
Glucose, Bld: 124 mg/dL — ABNORMAL HIGH (ref 70–99)
POTASSIUM: 2.9 meq/L — AB (ref 3.7–5.3)
Sodium: 141 mEq/L (ref 137–147)
TOTAL PROTEIN: 6.9 g/dL (ref 6.0–8.3)
Total Bilirubin: 0.9 mg/dL (ref 0.3–1.2)

## 2014-05-11 LAB — GLUCOSE, CAPILLARY: Glucose-Capillary: 240 mg/dL — ABNORMAL HIGH (ref 70–99)

## 2014-05-11 LAB — D-DIMER, QUANTITATIVE: D-Dimer, Quant: 2.26 ug/mL-FEU — ABNORMAL HIGH (ref 0.00–0.48)

## 2014-05-11 LAB — ROTAVIRUS ANTIGEN, STOOL

## 2014-05-11 MED ORDER — IPRATROPIUM-ALBUTEROL 0.5-2.5 (3) MG/3ML IN SOLN
3.0000 mL | Freq: Once | RESPIRATORY_TRACT | Status: AC
  Administered 2014-05-11: 3 mL via RESPIRATORY_TRACT
  Filled 2014-05-11: qty 3

## 2014-05-11 MED ORDER — LATANOPROST 0.005 % OP SOLN
1.0000 [drp] | Freq: Every day | OPHTHALMIC | Status: DC
  Administered 2014-05-11 – 2014-05-12 (×2): 1 [drp] via OPHTHALMIC
  Filled 2014-05-11: qty 2.5

## 2014-05-11 MED ORDER — SIMVASTATIN 20 MG PO TABS: 20.0000 mg | ORAL_TABLET | Freq: Every day | ORAL | Status: DC

## 2014-05-11 MED ORDER — NITROGLYCERIN 0.4 MG SL SUBL: 0.4000 mg | SUBLINGUAL_TABLET | SUBLINGUAL | Status: DC | PRN

## 2014-05-11 MED ORDER — PREDNISONE 10 MG PO TABS: 10.0000 mg | ORAL_TABLET | Freq: Every day | ORAL | Status: DC

## 2014-05-11 MED ORDER — ONDANSETRON HCL 4 MG/2ML IJ SOLN
4.0000 mg | Freq: Once | INTRAMUSCULAR | Status: AC
  Administered 2014-05-11: 4 mg via INTRAVENOUS
  Filled 2014-05-11: qty 2

## 2014-05-11 MED ORDER — MECLIZINE HCL 12.5 MG PO TABS: 12.5000 mg | ORAL_TABLET | Freq: Three times a day (TID) | ORAL | Status: DC | PRN

## 2014-05-11 MED ORDER — ALBUTEROL SULFATE (2.5 MG/3ML) 0.083% IN NEBU
2.5000 mg | INHALATION_SOLUTION | Freq: Once | RESPIRATORY_TRACT | Status: AC
  Administered 2014-05-11: 2.5 mg via RESPIRATORY_TRACT
  Filled 2014-05-11: qty 3

## 2014-05-11 MED ORDER — LORATADINE 10 MG PO TABS
10.0000 mg | ORAL_TABLET | Freq: Every morning | ORAL | Status: DC
  Administered 2014-05-12 – 2014-05-15 (×4): 10 mg via ORAL
  Filled 2014-05-11 (×4): qty 1

## 2014-05-11 MED ORDER — POTASSIUM CHLORIDE 10 MEQ/100ML IV SOLN
10.0000 meq | INTRAVENOUS | Status: AC
  Administered 2014-05-11 (×4): 10 meq via INTRAVENOUS
  Filled 2014-05-11 (×4): qty 100

## 2014-05-11 MED ORDER — PANTOPRAZOLE SODIUM 40 MG PO TBEC
80.0000 mg | DELAYED_RELEASE_TABLET | Freq: Every day | ORAL | Status: DC
  Filled 2014-05-11: qty 2

## 2014-05-11 MED ORDER — HEPARIN SODIUM (PORCINE) 5000 UNIT/ML IJ SOLN
5000.0000 [IU] | Freq: Three times a day (TID) | INTRAMUSCULAR | Status: AC
  Administered 2014-05-11 – 2014-05-14 (×9): 5000 [IU] via SUBCUTANEOUS
  Filled 2014-05-11 (×10): qty 1

## 2014-05-11 MED ORDER — SODIUM CHLORIDE 0.9 % IV BOLUS (SEPSIS)
1000.0000 mL | Freq: Once | INTRAVENOUS | Status: AC
  Administered 2014-05-11: 1000 mL via INTRAVENOUS

## 2014-05-11 MED ORDER — POTASSIUM CHLORIDE IN NACL 40-0.9 MEQ/L-% IV SOLN
INTRAVENOUS | Status: DC
  Administered 2014-05-11 – 2014-05-13 (×5): via INTRAVENOUS

## 2014-05-11 MED ORDER — LAMOTRIGINE 25 MG PO TABS
25.0000 mg | ORAL_TABLET | Freq: Two times a day (BID) | ORAL | Status: DC
  Administered 2014-05-11 – 2014-05-15 (×8): 25 mg via ORAL
  Filled 2014-05-11 (×10): qty 1

## 2014-05-11 MED ORDER — INSULIN ASPART 100 UNIT/ML ~~LOC~~ SOLN
0.0000 [IU] | Freq: Every day | SUBCUTANEOUS | Status: DC
  Administered 2014-05-11: 23:00:00 via SUBCUTANEOUS
  Administered 2014-05-12: 5 [IU] via SUBCUTANEOUS
  Administered 2014-05-12 – 2014-05-14 (×2): 4 [IU] via SUBCUTANEOUS

## 2014-05-11 MED ORDER — INSULIN ASPART 100 UNIT/ML ~~LOC~~ SOLN
6.0000 [IU] | Freq: Three times a day (TID) | SUBCUTANEOUS | Status: DC
  Administered 2014-05-12 – 2014-05-13 (×3): 6 [IU] via SUBCUTANEOUS

## 2014-05-11 MED ORDER — ATORVASTATIN CALCIUM 10 MG PO TABS
10.0000 mg | ORAL_TABLET | Freq: Every day | ORAL | Status: DC
  Administered 2014-05-12 – 2014-05-14 (×3): 10 mg via ORAL
  Filled 2014-05-11 (×3): qty 1

## 2014-05-11 MED ORDER — IOHEXOL 300 MG/ML  SOLN: 100.0000 mL | Freq: Once | INTRAMUSCULAR | Status: AC | PRN

## 2014-05-11 MED ORDER — DEXTROSE-NACL 5-0.9 % IV SOLN
INTRAVENOUS | Status: DC
  Administered 2014-05-11: 16:00:00 via INTRAVENOUS

## 2014-05-11 MED ORDER — INSULIN GLARGINE 100 UNIT/ML ~~LOC~~ SOLN
43.0000 [IU] | Freq: Every day | SUBCUTANEOUS | Status: DC
  Filled 2014-05-11 (×3): qty 0.43

## 2014-05-11 MED ORDER — IOHEXOL 350 MG/ML SOLN
100.0000 mL | Freq: Once | INTRAVENOUS | Status: AC | PRN
  Administered 2014-05-11: 100 mL via INTRAVENOUS

## 2014-05-11 MED ORDER — ALBUTEROL SULFATE (2.5 MG/3ML) 0.083% IN NEBU: 2.5000 mg | INHALATION_SOLUTION | Freq: Four times a day (QID) | RESPIRATORY_TRACT | Status: DC | PRN

## 2014-05-11 MED ORDER — MOMETASONE FURO-FORMOTEROL FUM 100-5 MCG/ACT IN AERO
2.0000 | INHALATION_SPRAY | Freq: Two times a day (BID) | RESPIRATORY_TRACT | Status: DC
  Administered 2014-05-12 – 2014-05-15 (×7): 2 via RESPIRATORY_TRACT
  Filled 2014-05-11: qty 8.8

## 2014-05-11 MED ORDER — GLIMEPIRIDE 2 MG PO TABS
2.0000 mg | ORAL_TABLET | Freq: Every day | ORAL | Status: DC
  Administered 2014-05-12: 2 mg via ORAL
  Filled 2014-05-11: qty 1

## 2014-05-11 MED ORDER — ISOSORBIDE MONONITRATE ER 60 MG PO TB24
60.0000 mg | ORAL_TABLET | Freq: Every morning | ORAL | Status: DC
  Administered 2014-05-12 – 2014-05-15 (×4): 60 mg via ORAL
  Filled 2014-05-11 (×4): qty 1

## 2014-05-11 MED ORDER — LEVOFLOXACIN IN D5W 500 MG/100ML IV SOLN
500.0000 mg | Freq: Once | INTRAVENOUS | Status: AC
  Administered 2014-05-11: 500 mg via INTRAVENOUS
  Filled 2014-05-11: qty 100

## 2014-05-11 MED ORDER — INSULIN ASPART 100 UNIT/ML ~~LOC~~ SOLN
0.0000 [IU] | Freq: Three times a day (TID) | SUBCUTANEOUS | Status: DC
  Administered 2014-05-12 (×2): 11 [IU] via SUBCUTANEOUS
  Administered 2014-05-12: 4 [IU] via SUBCUTANEOUS
  Administered 2014-05-13: 15 [IU] via SUBCUTANEOUS
  Administered 2014-05-13: 11 [IU] via SUBCUTANEOUS
  Administered 2014-05-14: 4 [IU] via SUBCUTANEOUS

## 2014-05-11 MED ORDER — VERAPAMIL HCL ER 240 MG PO TBCR
240.0000 mg | EXTENDED_RELEASE_TABLET | Freq: Every day | ORAL | Status: DC
  Administered 2014-05-11 – 2014-05-14 (×4): 240 mg via ORAL
  Filled 2014-05-11 (×4): qty 1

## 2014-05-11 MED ORDER — METFORMIN HCL 500 MG PO TABS: 1000.0000 mg | ORAL_TABLET | Freq: Two times a day (BID) | ORAL | Status: DC

## 2014-05-11 MED ORDER — PROMETHAZINE HCL 12.5 MG PO TABS: 25.0000 mg | ORAL_TABLET | Freq: Three times a day (TID) | ORAL | Status: DC | PRN

## 2014-05-11 MED ORDER — ALBUTEROL SULFATE (2.5 MG/3ML) 0.083% IN NEBU
5.0000 mg | INHALATION_SOLUTION | Freq: Once | RESPIRATORY_TRACT | Status: DC
Start: 2014-05-11 — End: 2014-05-11

## 2014-05-11 MED ORDER — ONDANSETRON HCL 4 MG/2ML IJ SOLN: 4.0000 mg | Freq: Four times a day (QID) | INTRAMUSCULAR | Status: DC | PRN

## 2014-05-11 MED ORDER — MAGNESIUM OXIDE 400 (241.3 MG) MG PO TABS
400.0000 mg | ORAL_TABLET | Freq: Two times a day (BID) | ORAL | Status: DC
  Administered 2014-05-11 – 2014-05-15 (×8): 400 mg via ORAL
  Filled 2014-05-11 (×8): qty 1

## 2014-05-11 MED ORDER — LATANOPROST 0.005 % OP SOLN
OPHTHALMIC | Status: AC
  Filled 2014-05-11: qty 2.5

## 2014-05-11 MED ORDER — METHYLPREDNISOLONE SODIUM SUCC 125 MG IJ SOLR
125.0000 mg | Freq: Once | INTRAMUSCULAR | Status: AC
  Administered 2014-05-11: 125 mg via INTRAVENOUS
  Filled 2014-05-11: qty 2

## 2014-05-11 MED ORDER — HYDROCODONE-ACETAMINOPHEN 10-325 MG PO TABS
1.0000 | ORAL_TABLET | Freq: Three times a day (TID) | ORAL | Status: DC | PRN
Start: 2014-05-11 — End: 2014-05-15
  Administered 2014-05-13 – 2014-05-15 (×5): 1 via ORAL
  Filled 2014-05-11 (×5): qty 1

## 2014-05-11 MED ORDER — IPRATROPIUM BROMIDE 0.02 % IN SOLN: 0.5000 mg | Freq: Once | RESPIRATORY_TRACT | Status: DC

## 2014-05-11 MED ORDER — NYSTATIN 100000 UNIT/ML MT SUSP
5.0000 mL | Freq: Two times a day (BID) | OROMUCOSAL | Status: DC
  Administered 2014-05-11 – 2014-05-15 (×8): 500000 [IU] via ORAL
  Filled 2014-05-11 (×8): qty 5

## 2014-05-11 MED ORDER — ASPIRIN EC 81 MG PO TBEC
81.0000 mg | DELAYED_RELEASE_TABLET | Freq: Every day | ORAL | Status: DC
  Administered 2014-05-11 – 2014-05-14 (×4): 81 mg via ORAL
  Filled 2014-05-11 (×4): qty 1

## 2014-05-11 MED ORDER — LAMOTRIGINE 25 MG PO TABS
ORAL_TABLET | ORAL | Status: AC
  Filled 2014-05-11: qty 1

## 2014-05-11 MED ORDER — VITAMIN D 1000 UNITS PO TABS
1000.0000 [IU] | ORAL_TABLET | Freq: Every day | ORAL | Status: DC
  Administered 2014-05-12 – 2014-05-15 (×4): 1000 [IU] via ORAL
  Filled 2014-05-11 (×4): qty 1

## 2014-05-11 MED ORDER — ONDANSETRON HCL 4 MG PO TABS
4.0000 mg | ORAL_TABLET | Freq: Four times a day (QID) | ORAL | Status: DC | PRN
  Administered 2014-05-13: 4 mg via ORAL
  Filled 2014-05-11: qty 1

## 2014-05-11 NOTE — ED Provider Notes (Signed)
CSN: [id number]     Arrival date & time 05/11/14  1243 History  This chart was scribed for Ward Givens, MD by Danella Maiers, ED Scribe. This patient was seen in room APA06/APA06 and the patient's care was started at 1:01 PM.    Chief Complaint  Patient presents with  . Hypoglycemia   The history is provided by the patient. No language interpreter was used.   HPI Comments: Kelli Stewart is a 68 y.o. female with a h/o DM who presents to the Emergency Department complaining of constant nausea for the last 3 weeks with intermittent vomiting and diarrhea. Last episode of vomiting was 3am this morning. She reports 3 episodes of watery diarrhea per day for the last 12 days. She reports RLQ abdominal pain only with episodes of diarrhea. She states she has lost 12 pounds since the onset of symptoms. She was seen in the ER 9 days ago for the diarrhea and given 2L of fluid and K+ supplementation. She was not admitted. No recent antibiotic use. She also reports hypoglycemia onset yesterday. Pt reports her blood sugar was 41 at 11am this morning. States she took a few sips of Coke and took a few bites of a honeybun but vomiting it back up. One hour later her blood sugar was 51. She states it was low yesterday also. She reports weakness and dizziness. She denies fevers.   She has a h/o C dif and states this feels different. She feels weak. She denies fever.  She also reports a persistent dry cough for 3 weeks with associated wheezing. She denies fever. She has had to use inhalers in the past.  PCP - Truddie Coco, NP in [place]   Past Medical History  Diagnosis Date  . PMR (polymyalgia rheumatica)   . Giant cell arteritis   . Acute coronary syndrome   . Diabetes mellitus   . Glaucoma   . Asthma   . Dyspnea   . Chest pain   . Osteoarthritis   . Migraine   . Macular pucker   . C. difficile colitis   . Pyelonephritis   . Bronchitis    Past Surgical History  Procedure Laterality Date  . Knee  surgery    . Cholecystectomy    . Appendectomy    . Finger surgery    . Foot surgery    . Coronary stent placement    . Skin biopsy    . Eye surgery     History reviewed. No pertinent family history. History  Substance Use Topics  . Smoking status: Never Smoker   . Smokeless tobacco: Never Used  . Alcohol Use: No   She does not smoke or drink.  She lives alone.   OB History   Grav Para Term Preterm Abortions TAB SAB Ect Mult Living   3 2 2  1  1   2      Review of Systems  Constitutional: Positive for unexpected weight change. Negative for fever.  Respiratory: Positive for cough.   Gastrointestinal: Positive for nausea, vomiting, abdominal pain and diarrhea.  Neurological: Positive for dizziness and weakness.  All other systems reviewed and are negative.     Allergies  Food; Iodine; Motrin; Ambien; Betadine; Carisoprodol; Celebrex; Enalapril; Hctz; Lasix; Maxidex; Maxzide; Theophyllines; and Lopressor  Home Medications   Prior to Admission medications   Medication Sig Start Date End Date Taking? Authorizing Provider  albuterol (PROVENTIL HFA;VENTOLIN HFA) 108 (90 BASE) MCG/ACT inhaler Inhale 2 puffs into the  lungs every 6 (six) hours as needed. For shortness of breath/wheezing   Yes Historical Provider, MD  aspirin EC 81 MG tablet Take 81 mg by mouth at bedtime.    Yes Historical Provider, MD  Calcium Carbonate-Vitamin D (CALCIUM PLUS VITAMIN D PO) Take 1 tablet by mouth daily.   Yes Historical Provider, MD  chlorthalidone (HYGROTON) 25 MG tablet Take 25 mg by mouth daily.   Yes Historical Provider, MD  Cholecalciferol (VITAMIN D-3) 1000 UNITS CAPS Take 1 capsule by mouth daily.   Yes Historical Provider, MD  diphenoxylate-atropine (LOMOTIL) 2.5-0.025 MG per tablet Take 1-2 tablets by mouth daily as needed for diarrhea or loose stools.   Yes Historical Provider, MD  Fluticasone-Salmeterol (ADVAIR) 100-50 MCG/DOSE AEPB Inhale 1 puff into the lungs every 12 (twelve)  hours.   Yes Historical Provider, MD  glimepiride (AMARYL) 2 MG tablet Take 2 mg by mouth daily before breakfast.   Yes Historical Provider, MD  HYDROcodone-acetaminophen (NORCO) 10-325 MG per tablet Take 1 tablet by mouth 3 (three) times daily as needed. pain 04/11/14  Yes Historical Provider, MD  insulin glargine (LANTUS) 100 UNIT/ML injection Inject 43 Units into the skin daily.    Yes Historical Provider, MD  insulin regular (NOVOLIN R,HUMULIN R) 100 units/mL injection Inject 5-10 Units into the skin 3 (three) times daily before meals. Based on sliding scale   Yes Historical Provider, MD  isosorbide mononitrate (IMDUR) 60 MG 24 hr tablet Take 60 mg by mouth every morning.    Yes Historical Provider, MD  lamoTRIgine (LAMICTAL) 25 MG tablet Take 25 mg by mouth 2 (two) times daily.   Yes Historical Provider, MD  latanoprost (XALATAN) 0.005 % ophthalmic solution Place 1 drop into both eyes at bedtime.   Yes Historical Provider, MD  loratadine (CLARITIN) 10 MG tablet Take 10 mg by mouth every morning.    Yes Historical Provider, MD  Magnesium 250 MG TABS Take 1 tablet by mouth 2 (two) times daily.   Yes Historical Provider, MD  metFORMIN (GLUCOPHAGE) 1000 MG tablet Take 1,000 mg by mouth 2 (two) times daily with a meal.   Yes Historical Provider, MD  Multiple Vitamins-Minerals (MULTIVITAMINS THER. W/MINERALS) TABS tablet Take 1 tablet by mouth daily.   Yes Historical Provider, MD  nystatin (MYCOSTATIN) 100000 UNIT/ML suspension Take 5 mLs by mouth 2 (two) times daily. 04/02/14  Yes Historical Provider, MD  omeprazole (PRILOSEC) 40 MG capsule Take 40 mg by mouth daily.   Yes Historical Provider, MD  potassium chloride (K-DUR) 10 MEQ tablet Take 10 mEq by mouth daily.   Yes Historical Provider, MD  promethazine (PHENERGAN) 25 MG tablet Take 25 mg by mouth every 8 (eight) hours as needed for nausea or vomiting.   Yes Historical Provider, MD  ranitidine (ZANTAC) 300 MG tablet Take 1 tablet by mouth daily.  04/28/14  Yes Historical Provider, MD  simvastatin (ZOCOR) 20 MG tablet Take 20 mg by mouth daily.   Yes Historical Provider, MD  verapamil (VERELAN PM) 240 MG 24 hr capsule Take 240 mg by mouth at bedtime.   Yes Historical Provider, MD  meclizine (ANTIVERT) 12.5 MG tablet Take 12.5 mg by mouth 3 (three) times daily as needed for dizziness.    Historical Provider, MD  nitroGLYCERIN (NITROSTAT) 0.4 MG SL tablet Place 0.4 mg under the tongue every 5 (five) minutes x 3 doses as needed. For chest pain. Do not exceed 3 tablets in 15 minutes    Historical Provider, MD  BP 102/73  Pulse 126  Temp(Src) 98 F (36.7 C) (Oral)  Resp 22  Ht 5\' 7"  (1.702 m)  Wt 192 lb (87.091 kg)  BMI 30.06 kg/m2  SpO2 98%  Vital signs normal except for tachycardia and borderline blood pressure  Physical Exam  Nursing note and vitals reviewed. Constitutional: She is oriented to person, place, and time. She appears well-developed and well-nourished.  Non-toxic appearance. She does not appear ill. No distress.  HENT:  Head: Normocephalic and atraumatic.  Right Ear: External ear normal.  Left Ear: External ear normal.  Nose: Nose normal. No mucosal edema or rhinorrhea.  Mouth/Throat: Oropharynx is clear and moist and mucous membranes are normal. No dental abscesses or uvula swelling.  Tongue appears a little dry  Eyes: Conjunctivae and EOM are normal. Pupils are equal, round, and reactive to light.  Neck: Normal range of motion and full passive range of motion without pain. Neck supple.  Cardiovascular: Normal rate, regular rhythm and normal heart sounds.  Exam reveals no gallop and no friction rub.   No murmur heard. Pulmonary/Chest: Effort normal. No respiratory distress. She has wheezes. She has no rhonchi. She has no rales. She exhibits no tenderness and no crepitus.  Diminished breath sounds. Scattered rhonchi and wheezing. Coughed when she took a big deep breath  Abdominal: Soft. Normal appearance and bowel  sounds are normal. She exhibits no distension. There is tenderness (diffusely). There is no rebound and no guarding.  States pain is worse in RLQ  Musculoskeletal: Normal range of motion. She exhibits no edema and no tenderness.  Moves all extremities well.   Neurological: She is alert and oriented to person, place, and time. She has normal strength. No cranial nerve deficit.  Skin: Skin is warm, dry and intact. No rash noted. No erythema. No pallor.  Psychiatric: She has a normal mood and affect. Her speech is normal and behavior is normal. Her mood appears not anxious.    ED Course  Procedures (including critical care time) Medications  dextrose 5 %-0.9 % sodium chloride infusion ( Intravenous New Bag/Given 05/11/14 1531)  potassium chloride 10 mEq in 100 mL IVPB (10 mEq Intravenous New Bag/Given 05/11/14 2000)  iohexol (OMNIPAQUE) 300 MG/ML solution 100 mL (not administered)  sodium chloride 0.9 % bolus 1,000 mL (0 mLs Intravenous Stopped 05/11/14 1531)  methylPREDNISolone sodium succinate (SOLU-MEDROL) 125 mg/2 mL injection 125 mg (125 mg Intravenous Given 05/11/14 1428)  levofloxacin (LEVAQUIN) IVPB 500 mg (0 mg Intravenous Stopped 05/11/14 1531)  ipratropium-albuterol (DUONEB) 0.5-2.5 (3) MG/3ML nebulizer solution 3 mL (3 mLs Nebulization Given 05/11/14 1438)  albuterol (PROVENTIL) (2.5 MG/3ML) 0.083% nebulizer solution 2.5 mg (2.5 mg Nebulization Given 05/11/14 1438)  ondansetron (ZOFRAN) injection 4 mg (4 mg Intravenous Given 05/11/14 1612)  iohexol (OMNIPAQUE) 350 MG/ML injection 100 mL (100 mLs Intravenous Contrast Given 05/11/14 1707)    DIAGNOSTIC STUDIES: Oxygen Saturation is 98% on RA, normal by my interpretation.    COORDINATION OF CARE: 1:14 PM- Discussed treatment plan with pt which includes CXR, blood work, UA. Will give breathing treatment and IV fluids.  Pt agrees to plan.   Patient was given IV potassium for hypokalemia. She was not given potassium orally because of her vomiting and  diarrhea. She was started on Levaquin for bronchitis. She was given an albuterol/Atrovent nebulizer for her shortness of breath and wheezing. Because of her hypoglycemia and her inability to eat she was started on D5 normal saline after getting 1 L of normal saline bolus  for her dehydration. She was given Zofran for her nausea.  1545 I discussed patient's test results with patient. 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 She reports she's been having some discomfort in her left leg recently. Doppler ultrasound of her left lower leg and spiral chest CT was ordered to rule out DVT and pulmonary embolus respectively.  18:00 patient given results of her spiral CT scan and her Doppler ultrasound which were normal. She states she feels too weak to go home. She still has some nausea.  20:04 Dr Karilyn Cota, admit to obs, med-surg, team 1  Labs Review Results for orders placed during the hospital encounter of 05/11/14  CBC WITH DIFFERENTIAL      Result Value Ref Range   WBC 9.2  4.0 - 10.5 K/uL   RBC 4.40  3.87 - 5.11 MIL/uL   Hemoglobin 14.1  12.0 - 15.0 g/dL   HCT 40.9  81.1 - 91.4 %   MCV 95.0  78.0 - 100.0 fL   MCH 32.0  26.0 - 34.0 pg   MCHC 33.7  30.0 - 36.0 g/dL   RDW 78.2 (*) 95.6 - 21.3 %   Platelets 276  150 - 400 K/uL   Neutrophils Relative % 71  43 - 77 %   Neutro Abs 6.5  1.7 - 7.7 K/uL   Lymphocytes Relative 19  12 - 46 %   Lymphs Abs 1.8  0.7 - 4.0 K/uL   Monocytes Relative 10  3 - 12 %   Monocytes Absolute 0.9  0.1 - 1.0 K/uL   Eosinophils Relative 0  0 - 5 %   Eosinophils Absolute 0.0  0.0 - 0.7 K/uL   Basophils Relative 0  0 - 1 %   Basophils Absolute 0.0  0.0 - 0.1 K/uL  COMPREHENSIVE METABOLIC PANEL      Result Value Ref Range   Sodium 141  137 - 147 mEq/L   Potassium 2.9 (*) 3.7 - 5.3 mEq/L   Chloride 95 (*) 96 - 112 mEq/L   CO2 30  19 - 32 mEq/L   Glucose, Bld 124 (*) 70 - 99 mg/dL   BUN 13  6 - 23 mg/dL   Creatinine, Ser 0.86  0.50 -  1.10 mg/dL   Calcium 9.0  8.4 - 57.8 mg/dL   Total Protein 6.9  6.0 - 8.3 g/dL   Albumin 3.1 (*) 3.5 - 5.2 g/dL   AST 37  0 - 37 U/L   ALT 26  0 - 35 U/L   Alkaline Phosphatase 65  39 - 117 U/L   Total Bilirubin 0.9  0.3 - 1.2 mg/dL   GFR calc non Af Amer 72 (*) >90 mL/min   GFR calc Af Amer 83 (*) >90 mL/min  ROTAVIRUS ANTIGEN, STOOL      Result Value Ref Range   Rotavirus PENDING  NEGATIVE   Specimen Source-ROTV STOOL    D-DIMER, QUANTITATIVE      Result Value Ref Range   D-Dimer, Quant 2.26 (*) 0.00 - 0.48 ug/mL-FEU  URINALYSIS, ROUTINE W REFLEX MICROSCOPIC      Result Value Ref Range   Color, Urine YELLOW  YELLOW   APPearance CLEAR  CLEAR   Specific Gravity, Urine <1.005 (*) 1.005 - 1.030   pH 6.0  5.0 - 8.0   Glucose, UA NEGATIVE  NEGATIVE mg/dL   Hgb urine dipstick NEGATIVE  NEGATIVE   Bilirubin Urine NEGATIVE  NEGATIVE   Ketones, ur NEGATIVE  NEGATIVE mg/dL   Protein, ur NEGATIVE  NEGATIVE mg/dL   Urobilinogen, UA 0.2  0.0 - 1.0 mg/dL   Nitrite NEGATIVE  NEGATIVE   Leukocytes, UA TRACE (*) NEGATIVE  URINE MICROSCOPIC-ADD ON      Result Value Ref Range   Squamous Epithelial / LPF FEW (*) RARE   WBC, UA 3-6  <3 WBC/hpf   Bacteria, UA FEW (*) RARE  CBG MONITORING, ED      Result Value Ref Range   Glucose-Capillary 126 (*) 70 - 99 mg/dL   Laboratory interpretation all normal except elevated d-dimer, hypokalemia      Imaging Review Dg Chest 2 View  05/11/2014   CLINICAL DATA:  Cough and wheezing for 3 weeks.  EXAM: CHEST  2 VIEW  COMPARISON:  PA and lateral chest 10/30/2013.  FINDINGS: Lungs are clear. Heart size is normal. There is no pneumothorax or pleural effusion.  IMPRESSION: No acute disease.   Electronically Signed   By: Drusilla Kannerhomas  Dalessio M.D.   On: 05/11/2014 15:12   Ct Angio Chest W/cm &/or Wo Cm  05/11/2014   CLINICAL DATA:  Shortness of breath and leg pain. Evaluate for pulmonary embolus.  EXAM: CT ANGIOGRAPHY CHEST WITH CONTRAST  TECHNIQUE: Multidetector  CT imaging of the chest was performed using the standard protocol during bolus administration of intravenous contrast. Multiplanar CT image reconstructions and MIPs were obtained to evaluate the vascular anatomy.  CONTRAST:  100mL OMNIPAQUE IOHEXOL 350 MG/ML SOLN  COMPARISON:  None.  FINDINGS: No pulmonary embolus. No pathologically enlarged mediastinal, hilar or axillary lymph nodes. Atherosclerotic calcification of the arterial vasculature, including coronary arteries. Heart is at the upper limits of normal in size to mildly enlarged. No pericardial effusion.  Lungs are clear.  No pleural fluid.  Airway is unremarkable.  Incidental imaging of the upper abdomen shows marked low-attenuation with throughout the visualized portion of the liver. Visualized portions of the right adrenal gland, spleen and stomach are grossly unremarkable. No worrisome lytic or sclerotic lesions. Degenerative changes are seen in the spine.  Review of the MIP images confirms the above findings.  IMPRESSION: 1. Negative for pulmonary embolus. 2. Coronary artery calcification. 3. Hepatic steatosis.   Electronically Signed   By: Leanna BattlesMelinda  Blietz M.D.   On: 05/11/2014 17:32   [place] Venous Img Lower Unilateral Left  05/11/2014   CLINICAL DATA:  Left leg swelling, reported history of prior DVT  EXAM: LEFT LOWER EXTREMITY VENOUS DOPPLER ULTRASOUND  TECHNIQUE: Gray-scale sonography with graded compression, as well as color Doppler and duplex ultrasound were performed to evaluate the lower extremity deep venous systems from the level of the common femoral vein and including the common femoral, femoral, profunda femoral, popliteal and calf veins including the posterior tibial, peroneal and gastrocnemius veins when visible. The superficial great saphenous vein was also interrogated. Spectral Doppler was utilized to evaluate flow at rest and with distal augmentation maneuvers in the common femoral, femoral and popliteal veins.  COMPARISON:  None.   FINDINGS: Common Femoral Vein: No evidence of thrombus. Normal compressibility, respiratory phasicity and response to augmentation.  Saphenofemoral Junction: No evidence of thrombus. Normal compressibility and flow on color Doppler imaging.  Profunda Femoral Vein: No evidence of thrombus. Normal compressibility and flow on color Doppler imaging.  Femoral Vein: No evidence of thrombus. Normal compressibility, respiratory phasicity and response to augmentation.  Popliteal Vein: No evidence of thrombus. Normal compressibility, respiratory phasicity and response to augmentation.  Calf  Veins: No evidence of thrombus. Normal compressibility and flow on color Doppler imaging.  Superficial Great Saphenous Vein: No evidence of thrombus. Normal compressibility and flow on color Doppler imaging.  Venous Reflux:  None.  Other Findings:  None.  IMPRESSION: No evidence of deep venous thrombosis.   Electronically Signed   By: Salome Holmes M.D.   On: 05/11/2014 16:54     EKG Interpretation   Date/Time:  Friday May 11 2014 17:32:57 EDT Ventricular Rate:  111 PR Interval:  167 QRS Duration: 82 QT Interval:  355 QTC Calculation: 482 R Axis:   68 Text Interpretation:  Sinus tachycardia Borderline T abnormalities,  lateral leads Baseline wander in lead(s) V6 No significant change since  last tracing 30 Oct 2013 Confirmed by [facility]  MD-I, Jenavieve Freda ([id number]) on 05/11/2014  5:41:23 PM      MDM   Final diagnoses:  Bronchitis  Hypokalemia  Nausea vomiting and diarrhea  Bronchospasm  Hypoglycemia    Plan admission  Devoria Albe, MD, FACEP   I personally performed the services described in this documentation, which was scribed in my presence. The recorded information has been reviewed and considered.  Devoria Albe, MD, Armando Gang   Ward Givens, MD 05/11/14 2038

## 2014-05-11 NOTE — H&P (Signed)
[facility] History and Physical  Kelli Stewart ZOX:[id number] DOB: 01/20/1946 DOA: 05/11/2014  Referring physician: ER. PCP: Jacquiline DoeALABANZA,THOMAS, MD   Chief Complaint: Vomiting and diarrhea.  HPI: Kelli KnudsenCarolyn W Fromm is a 68 y.o. 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 She denies any hematemesis or rectal bleeding. There is abdominal discomfort with the diarrhea. She has not had a fever. She had been to the emergency room in [place] and was given potassium and magnesium and not admitted at that time. She has not improved. She is diabetic, has polymyalgia rheumatica. Apparently about a month ago several medications were reduced for laminated by her primary care provider. The patient herself relates this to the beginning of her symptoms. She is not in contact with any other person who has had similar symptoms. She does not remember eating anything out of the ordinary. She now is being admitted for further investigation and management. In the emergency room, she was found to be hypokalemic.   Review of Systems:  Constitutional:  No weight loss, night sweats, Fevers, chills, fatigue.  HEENT:  No headaches, Difficulty swallowing,Tooth/dental problems,Sore throat,  No sneezing, itching, ear ache, nasal congestion, post nasal drip,  Cardio-vascular:  No chest pain, Orthopnea, PND, swelling in lower extremities, anasarca, dizziness, palpitations   Resp:  No shortness of breath with exertion or at rest. No excess mucus, no productive cough, No non-productive cough, No coughing up of blood.No change in color of mucus.No wheezing.No chest wall deformity  Skin:  no rash or lesions.  GU:  no dysuria, change in color of urine, no urgency or frequency. No flank pain.  Musculoskeletal:  No joint pain or swelling. No decreased range of motion. No back pain.  Psych:  No change in mood or affect. No depression or anxiety. No memory  loss.   Past Medical History  Diagnosis Date  . PMR (polymyalgia rheumatica)   . Giant cell arteritis   . Acute coronary syndrome   . Diabetes mellitus   . Glaucoma   . Asthma   . Dyspnea   . Chest pain   . Osteoarthritis   . Migraine   . Macular pucker   . C. difficile colitis   . Pyelonephritis   . Bronchitis    Past Surgical History  Procedure Laterality Date  . Knee surgery    . Cholecystectomy    . Appendectomy    . Finger surgery    . Foot surgery    . Coronary stent placement    . Skin biopsy    . Eye surgery     Social History:  reports that she has never smoked. She has never used smokeless tobacco. She reports that she does not drink alcohol or use illicit drugs.  Allergies  Allergen Reactions  . Food Anaphylaxis    WALNUTS  . Iodine Hives, Shortness Of Breath and Swelling  . Motrin [Ibuprofen] Hives, Shortness Of Breath and Swelling  . Ambien [Zolpidem Tartrate] Other (See Comments)    Sleepwalking, hallucinations  . Betadine [Povidone Iodine] Other (See Comments)    Blisters when placed on open skin  . Carisoprodol Other (See Comments)    Decreased pulse, syncope  . Celebrex [Celecoxib] Other (See Comments)    Chest pain  . Enalapril Hypertension  . Hctz [Hydrochlorothiazide] Other (See Comments)    Kidney pain  . Lasix [Furosemide] Other (See Comments)    Kidney pain  . Maxidex [Dexamethasone] Swelling  and Other (See Comments)    Kidney pain  . Maxzide [Hydrochlorothiazide W-Triamterene] Other (See Comments)    Kidney pain  . Theophyllines Other (See Comments) and Hypertension    Visual problems  . Lopressor [Metoprolol] Rash    Feet swelling     History reviewed. No pertinent family history.   Prior to Admission medications   Medication Sig Start Date End Date Taking? Authorizing Provider  albuterol (PROVENTIL HFA;VENTOLIN HFA) 108 (90 BASE) MCG/ACT inhaler Inhale 2 puffs into the lungs every 6 (six) hours as needed. For shortness of  breath/wheezing   Yes Historical Provider, MD  aspirin EC 81 MG tablet Take 81 mg by mouth at bedtime.    Yes Historical Provider, MD  Calcium Carbonate-Vitamin D (CALCIUM PLUS VITAMIN D PO) Take 1 tablet by mouth daily.   Yes Historical Provider, MD  chlorthalidone (HYGROTON) 25 MG tablet Take 25 mg by mouth daily.   Yes Historical Provider, MD  Cholecalciferol (VITAMIN D-3) 1000 UNITS CAPS Take 1 capsule by mouth daily.   Yes Historical Provider, MD  diphenoxylate-atropine (LOMOTIL) 2.5-0.025 MG per tablet Take 1-2 tablets by mouth daily as needed for diarrhea or loose stools.   Yes Historical Provider, MD  Fluticasone-Salmeterol (ADVAIR) 100-50 MCG/DOSE AEPB Inhale 1 puff into the lungs every 12 (twelve) hours.   Yes Historical Provider, MD  glimepiride (AMARYL) 2 MG tablet Take 2 mg by mouth daily before breakfast.   Yes Historical Provider, MD  HYDROcodone-acetaminophen (NORCO) 10-325 MG per tablet Take 1 tablet by mouth 3 (three) times daily as needed. pain 04/11/14  Yes Historical Provider, MD  insulin glargine (LANTUS) 100 UNIT/ML injection Inject 43 Units into the skin daily.    Yes Historical Provider, MD  insulin regular (NOVOLIN R,HUMULIN R) 100 units/mL injection Inject 5-10 Units into the skin 3 (three) times daily before meals. Based on sliding scale   Yes Historical Provider, MD  isosorbide mononitrate (IMDUR) 60 MG 24 hr tablet Take 60 mg by mouth every morning.    Yes Historical Provider, MD  lamoTRIgine (LAMICTAL) 25 MG tablet Take 25 mg by mouth 2 (two) times daily.   Yes Historical Provider, MD  latanoprost (XALATAN) 0.005 % ophthalmic solution Place 1 drop into both eyes at bedtime.   Yes Historical Provider, MD  loratadine (CLARITIN) 10 MG tablet Take 10 mg by mouth every morning.    Yes Historical Provider, MD  Magnesium 250 MG TABS Take 1 tablet by mouth 2 (two) times daily.   Yes Historical Provider, MD  metFORMIN (GLUCOPHAGE) 1000 MG tablet Take 1,000 mg by mouth 2 (two)  times daily with a meal.   Yes Historical Provider, MD  Multiple Vitamins-Minerals (MULTIVITAMINS THER. W/MINERALS) TABS tablet Take 1 tablet by mouth daily.   Yes Historical Provider, MD  nystatin (MYCOSTATIN) 100000 UNIT/ML suspension Take 5 mLs by mouth 2 (two) times daily. 04/02/14  Yes Historical Provider, MD  omeprazole (PRILOSEC) 40 MG capsule Take 40 mg by mouth daily.   Yes Historical Provider, MD  potassium chloride (K-DUR) 10 MEQ tablet Take 10 mEq by mouth daily.   Yes Historical Provider, MD  promethazine (PHENERGAN) 25 MG tablet Take 25 mg by mouth every 8 (eight) hours as needed for nausea or vomiting.   Yes Historical Provider, MD  ranitidine (ZANTAC) 300 MG tablet Take 1 tablet by mouth daily. 04/28/14  Yes Historical Provider, MD  simvastatin (ZOCOR) 20 MG tablet Take 20 mg by mouth daily.   Yes Historical Provider, MD  verapamil (VERELAN PM) 240 MG 24 hr capsule Take 240 mg by mouth at bedtime.   Yes Historical Provider, MD  meclizine (ANTIVERT) 12.5 MG tablet Take 12.5 mg by mouth 3 (three) times daily as needed for dizziness.    Historical Provider, MD  nitroGLYCERIN (NITROSTAT) 0.4 MG SL tablet Place 0.4 mg under the tongue every 5 (five) minutes x 3 doses as needed. For chest pain. Do not exceed 3 tablets in 15 minutes    Historical Provider, MD   Physical Exam: Filed Vitals:   05/11/14 1851  BP:   Pulse:   Temp: 98 F (36.7 C)  Resp:     BP 99/65  Pulse 112  Temp(Src) 98 F (36.7 C) (Oral)  Resp 22  Ht 5\' 7"  (1.702 m)  Wt 87.091 kg (192 lb)  BMI 30.06 kg/m2  SpO2 94%  General:  Appears calm and comfortable. She does not look clinically dehydrated. Eyes: PERRL, normal lids, irises & conjunctiva ENT: grossly normal hearing, lips & tongue Neck: no LAD, masses or thyromegaly Cardiovascular: RRR, no m/r/g. No LE edema. Telemetry: SR, no arrhythmias  Respiratory: CTA bilaterally, no w/r/r. Normal respiratory effort. Abdomen: soft, ntnd Skin: no rash or  induration seen on limited exam Musculoskeletal: grossly normal tone BUE/BLE Psychiatric: grossly normal mood and affect, speech fluent and appropriate Neurologic: grossly non-focal.          Labs on Admission:  Basic Metabolic Panel:  Recent Labs Lab 05/11/14 1320  NA 141  K 2.9*  CL 95*  CO2 30  GLUCOSE 124*  BUN 13  CREATININE 0.82  CALCIUM 9.0   Liver Function Tests:  Recent Labs Lab 05/11/14 1320  AST 37  ALT 26  ALKPHOS 65  BILITOT 0.9  PROT 6.9  ALBUMIN 3.1*   No results found for this basename: LIPASE, AMYLASE,  in the last 168 hours No results found for this basename: AMMONIA,  in the last 168 hours CBC:  Recent Labs Lab 05/11/14 1320  WBC 9.2  NEUTROABS 6.5  HGB 14.1  HCT 41.8  MCV 95.0  PLT 276   Cardiac Enzymes: No results found for this basename: CKTOTAL, CKMB, CKMBINDEX, TROPONINI,  in the last 168 hours  BNP (last 3 results)  Recent Labs  10/30/13 1236  PROBNP 75.7   CBG:  Recent Labs Lab 05/11/14 1256  GLUCAP 126*    Radiological Exams on Admission: Dg Chest 2 View  05/11/2014   CLINICAL DATA:  Cough and wheezing for 3 weeks.  EXAM: CHEST  2 VIEW  COMPARISON:  PA and lateral chest 10/30/2013.  FINDINGS: Lungs are clear. Heart size is normal. There is no pneumothorax or pleural effusion.  IMPRESSION: No acute disease.   Electronically Signed   By: Drusilla Kanner M.D.   On: 05/11/2014 15:12   Ct Angio Chest W/cm &/or Wo Cm  05/11/2014   CLINICAL DATA:  Shortness of breath and leg pain. Evaluate for pulmonary embolus.  EXAM: CT ANGIOGRAPHY CHEST WITH CONTRAST  TECHNIQUE: Multidetector CT imaging of the chest was performed using the standard protocol during bolus administration of intravenous contrast. Multiplanar CT image reconstructions and MIPs were obtained to evaluate the vascular anatomy.  CONTRAST:  OMNIPAQUE IOHEXOL 350 MG/ML SOLN  COMPARISON:  None.  FINDINGS: No pulmonary embolus. No pathologically enlarged  mediastinal, hilar or axillary lymph nodes. Atherosclerotic calcification of the arterial vasculature, including coronary arteries. Heart is at the upper limits of normal in size to mildly enlarged. No pericardial effusion.  Lungs are clear.  No pleural fluid.  Airway is unremarkable.  Incidental imaging of the upper abdomen shows marked low-attenuation with throughout the visualized portion of the liver. Visualized portions of the right adrenal gland, spleen and stomach are grossly unremarkable. No worrisome lytic or sclerotic lesions. Degenerative changes are seen in the spine.  Review of the MIP images confirms the above findings.  IMPRESSION: 1. Negative for pulmonary embolus. 2. Coronary artery calcification. 3. Hepatic steatosis.   Electronically Signed   By: Leanna Battles M.D.   On: 05/11/2014 17:32   US Venous Img Lower Unilateral Left  05/11/2014   CLINICAL DATA:  Left leg swelling, reported history of prior DVT  EXAM: LEFT LOWER EXTREMITY VENOUS DOPPLER ULTRASOUND  TECHNIQUE: Gray-scale sonography with graded compression, as well as color Doppler and duplex ultrasound were performed to evaluate the lower extremity deep venous systems from the level of the common femoral vein and including the common femoral, femoral, profunda femoral, popliteal and calf veins including the posterior tibial, peroneal and gastrocnemius veins when visible. The superficial great saphenous vein was also interrogated. Spectral Doppler was utilized to evaluate flow at rest and with distal augmentation maneuvers in the common femoral, femoral and popliteal veins.  COMPARISON:  None.  FINDINGS: Common Femoral Vein: No evidence of thrombus. Normal compressibility, respiratory phasicity and response to augmentation.  Saphenofemoral Junction: No evidence of thrombus. Normal compressibility and flow on color Doppler imaging.  Profunda Femoral Vein: No evidence of thrombus. Normal compressibility and flow on color Doppler imaging.   Femoral Vein: No evidence of thrombus. Normal compressibility, respiratory phasicity and response to augmentation.  Popliteal Vein: No evidence of thrombus. Normal compressibility, respiratory phasicity and response to augmentation.  Calf Veins: No evidence of thrombus. Normal compressibility and flow on color Doppler imaging.  Superficial Great Saphenous Vein: No evidence of thrombus. Normal compressibility and flow on color Doppler imaging.  Venous Reflux:  None.  Other Findings:  None.  IMPRESSION: No evidence of deep venous thrombosis.   Electronically Signed   By: Salome Holmes M.D.   On: 05/11/2014 16:54    Assessment/Plan   1. Vomiting and diarrhea, unclear etiology. Slightly unusual for viral illness to last this long, but it is possible. Could be related to withdrawal of some medications which were discontinued or reduced in dose. It appears the prednisone dose was drastically reduced approximately one month ago. 2. Hypertension. 3. Type 2 diabetes mellitus. 4. Polymyalgia rheumatica, on chronic steroids. 5. Obesity. 6. Clinical dehydration.  Plan: 1. Admit to medical floor. 2. Intravenous fluids with repletion of potassium. 3. CT scan of the abdomen. Consider gastroenterology consultation. 4. Monitor and control diabetes.  Further recommendations will depend on patient's hospital progress.   Code Status: Full code.  Family Communication: I discussed the plan with patient at the bedside.   Disposition Plan: Home when medically stable.   Time spent: 60 minutes.  Wilson Singer [facility] Pager [phone].  **Disclaimer: This note may have been dictated with voice recognition software. Similar sounding words can inadvertently be transcribed and this note may contain transcription errors which may not have been corrected upon publication of note.**

## 2014-05-11 NOTE — ED Notes (Addendum)
Nauseated x 3 weeks w/vomiting 3-4x day. At [facility] last Wednesday and was given 2L fluid and K+ supplementation.  At 1100 today CBG was 51.  Tried to take few bites of honeybun and Pepsi.  Has also had chronic cough that has not been evaluated.

## 2014-05-12 ENCOUNTER — Inpatient Hospital Stay (HOSPITAL_COMMUNITY): Payer: Medicare Other

## 2014-05-12 ENCOUNTER — Encounter (HOSPITAL_COMMUNITY): Payer: Self-pay | Admitting: Gastroenterology

## 2014-05-12 DIAGNOSIS — R1310 Dysphagia, unspecified: Secondary | ICD-10-CM | POA: Diagnosis present

## 2014-05-12 LAB — COMPREHENSIVE METABOLIC PANEL
ALK PHOS: 50 U/L (ref 39–117)
ALT: 22 U/L (ref 0–35)
AST: 27 U/L (ref 0–37)
Albumin: 2.5 g/dL — ABNORMAL LOW (ref 3.5–5.2)
BUN: 12 mg/dL (ref 6–23)
CALCIUM: 7.6 mg/dL — AB (ref 8.4–10.5)
CO2: 24 meq/L (ref 19–32)
Chloride: 100 mEq/L (ref 96–112)
Creatinine, Ser: 0.72 mg/dL (ref 0.50–1.10)
GFR, EST NON AFRICAN AMERICAN: 86 mL/min — AB (ref >90)
Glucose, Bld: 385 mg/dL — ABNORMAL HIGH (ref 70–99)
POTASSIUM: 3.5 meq/L — AB (ref 3.7–5.3)
SODIUM: 138 meq/L (ref 137–147)
Total Bilirubin: 0.5 mg/dL (ref 0.3–1.2)
Total Protein: 5.6 g/dL — ABNORMAL LOW (ref 6.0–8.3)

## 2014-05-12 LAB — GLUCOSE, CAPILLARY
Glucose-Capillary: 385 mg/dL — ABNORMAL HIGH (ref 70–99)
Glucose-Capillary: 286 mg/dL — ABNORMAL HIGH (ref 70–99)
Glucose-Capillary: 167 mg/dL — ABNORMAL HIGH (ref 70–99)
Glucose-Capillary: 288 mg/dL — ABNORMAL HIGH (ref 70–99)
Glucose-Capillary: 306 mg/dL — ABNORMAL HIGH (ref 70–99)

## 2014-05-12 LAB — CBC
HCT: 34.9 % — ABNORMAL LOW (ref 36.0–46.0)
Hemoglobin: 11.8 g/dL — ABNORMAL LOW (ref 12.0–15.0)
MCH: 31.8 pg (ref 26.0–34.0)
MCHC: 33.8 g/dL (ref 30.0–36.0)
MCV: 94.1 fL (ref 78.0–100.0)
PLATELETS: 202 10*3/uL (ref 150–400)
RBC: 3.71 MIL/uL — ABNORMAL LOW (ref 3.87–5.11)
RDW: 15.8 % — AB (ref 11.5–15.5)
WBC: 6.9 10*3/uL (ref 4.0–10.5)

## 2014-05-12 LAB — PROTIME-INR
INR: 1.17 (ref 0.00–1.49)
PROTHROMBIN TIME: 14.7 s (ref 11.6–15.2)

## 2014-05-12 LAB — CLOSTRIDIUM DIFFICILE BY PCR: CDIFFPCR: NEGATIVE

## 2014-05-12 MED ORDER — METFORMIN HCL 500 MG PO TABS: 1000.0000 mg | ORAL_TABLET | Freq: Two times a day (BID) | ORAL | Status: DC

## 2014-05-12 MED ORDER — ALBUTEROL SULFATE (2.5 MG/3ML) 0.083% IN NEBU
2.5000 mg | INHALATION_SOLUTION | Freq: Four times a day (QID) | RESPIRATORY_TRACT | Status: DC
  Administered 2014-05-12 – 2014-05-13 (×5): 2.5 mg via RESPIRATORY_TRACT
  Filled 2014-05-12 (×5): qty 3

## 2014-05-12 MED ORDER — PREDNISONE 20 MG PO TABS
60.0000 mg | ORAL_TABLET | Freq: Every day | ORAL | Status: DC
  Administered 2014-05-12 – 2014-05-13 (×2): 60 mg via ORAL
  Filled 2014-05-12 (×2): qty 3

## 2014-05-12 MED ORDER — ONDANSETRON HCL 4 MG/2ML IJ SOLN
4.0000 mg | Freq: Three times a day (TID) | INTRAMUSCULAR | Status: DC
  Administered 2014-05-12 – 2014-05-15 (×12): 4 mg via INTRAVENOUS
  Filled 2014-05-12 (×12): qty 2

## 2014-05-12 MED ORDER — PANTOPRAZOLE SODIUM 40 MG PO TBEC
40.0000 mg | DELAYED_RELEASE_TABLET | Freq: Two times a day (BID) | ORAL | Status: DC
  Administered 2014-05-12 – 2014-05-15 (×6): 40 mg via ORAL
  Filled 2014-05-12 (×4): qty 1

## 2014-05-12 MED ORDER — POTASSIUM CHLORIDE CRYS ER 20 MEQ PO TBCR
40.0000 meq | EXTENDED_RELEASE_TABLET | Freq: Once | ORAL | Status: AC
  Administered 2014-05-12: 40 meq via ORAL
  Filled 2014-05-12: qty 2

## 2014-05-12 MED ORDER — IOHEXOL 300 MG/ML  SOLN
50.0000 mL | Freq: Once | INTRAMUSCULAR | Status: AC | PRN
  Administered 2014-05-12: 50 mL via ORAL

## 2014-05-12 MED ORDER — IOHEXOL 300 MG/ML  SOLN
100.0000 mL | Freq: Once | INTRAMUSCULAR | Status: AC | PRN
  Administered 2014-05-12: 100 mL via INTRAVENOUS

## 2014-05-12 MED ORDER — METHOCARBAMOL 500 MG PO TABS
750.0000 mg | ORAL_TABLET | Freq: Four times a day (QID) | ORAL | Status: DC | PRN
  Administered 2014-05-12 – 2014-05-15 (×5): 750 mg via ORAL
  Filled 2014-05-12 (×5): qty 2

## 2014-05-12 MED ORDER — SODIUM CHLORIDE 0.9 % IJ SOLN
INTRAMUSCULAR | Status: AC
  Filled 2014-05-12: qty 500

## 2014-05-12 MED ORDER — INSULIN GLARGINE 100 UNIT/ML ~~LOC~~ SOLN
20.0000 [IU] | Freq: Every day | SUBCUTANEOUS | Status: DC
  Administered 2014-05-12 – 2014-05-13 (×2): 20 [IU] via SUBCUTANEOUS
  Filled 2014-05-12 (×3): qty 0.2

## 2014-05-12 NOTE — Progress Notes (Signed)
[facility] PROGRESS NOTE  Kelli Stewart:[id number] DOB: [birth year] DOA: 05/11/2014 PCP: Kelli Doe, MD  Assessment/Plan: 1. Suspected Adrenal Insufficiency -She reports previously taking Prednisone 30 mg PO q daily, which was cut down to 5 mg PO q daily, after which she started experiencing nausea/vomiting 3 weeks ago. -Will increase her steroid dose to 60 mg PO q daily today as she has not had further episodes of N/V since receiving 125 mg of SoluMedrol yesterday in the emergency room.  -Will likely need a slow steroid taper.  2. Nausea/Vomiting/Diarrhea.  -As mentioned above, could be related to adrenal insufficiency, with a sharp decrease in her steroid dose.  -CT scan of abdomen and pelvis ordered today to assess for other possible causes.  -Labs otherwise showing Deist count of 6.9 and has unremarkable abdominal exam  3. Asthma -Patient having bilateral expiratory wheezes with diminished breath sounds on exam -Will provide beta agonist therapy, starting Prednisone 60 mg PO q daily -CT scan of lungs did not reveal infiltrate  4. Diabetes Mellitus -Patient receiving contrast media overnight, will hold metformin -accuchecks ac and hs with sliding scale coverage -Continue Glargine 43 units [place] q daily  5. PMR -Stable, on steroids  6. Hypokalemia -Likely related to GI loss from N/V/Diarrhea -Potassium improving from 2.9 to 3.5  Code Status: Full Code Family Communication:  Disposition Plan: Continue supportive care, IV fluids, steroids, pending CT of abdomen    HPI/Subjective: Patient is a 68 year old female with a past medical history of PMR, on chronic steroid therapy, admitted overnight for nausea/vomiting/diarrhea/dehydration. She reporting taking 30 mg of Prednisone daily which was cut down to 5 mg PO daily after which symptoms started.    Objective: Filed Vitals:   05/12/14 0625  BP: 111/71  Pulse: 105  Temp: 98.1 F (36.7 C)  Resp: 20     Intake/Output Summary (Last 24 hours) at 05/12/14 0837 Last data filed at 05/12/14 0700  Gross per 24 hour  Intake      0 ml  Output      4 ml  Net     -4 ml   Filed Weights   05/11/14 1248 05/11/14 2233  Weight: 87.091 kg (192 lb) 87.091 kg (192 lb)    Exam:   General:  No acute distress, she is awake and alert  Cardiovascular: Regular rate and rhythm, normal S1S2  Respiratory: Diminished breath sounds, with bilateral expiratory wheezes  Abdomen: Soft nontender nondistended  Musculoskeletal: No edema  Data Reviewed: Basic Metabolic Panel:  Recent Labs Lab 05/11/14 1320 05/12/14 0549  NA 141 138  K 2.9* 3.5*  CL 95* 100  CO2 30 24  GLUCOSE 124* 385*  BUN 13 12  CREATININE 0.82 0.72  CALCIUM 9.0 7.6*   Liver Function Tests:  Recent Labs Lab 05/11/14 1320 05/12/14 0549  AST 37 27  ALT 26 22  ALKPHOS 65 50  BILITOT 0.9 0.5  PROT 6.9 5.6*  ALBUMIN 3.1* 2.5*   No results found for this basename: LIPASE, AMYLASE,  in the last 168 hours No results found for this basename: AMMONIA,  in the last 168 hours CBC:  Recent Labs Lab 05/11/14 1320 05/12/14 0549  WBC 9.2 6.9  NEUTROABS 6.5  --   HGB 14.1 11.8*  HCT 41.8 34.9*  MCV 95.0 94.1  PLT 276 202   Cardiac Enzymes: No results found for this basename: CKTOTAL, CKMB, CKMBINDEX, TROPONINI,  in the last 168 hours BNP (last 3 results)  Recent Labs  10/30/13 1236  PROBNP 75.7   CBG:  Recent Labs Lab 05/11/14 1256 05/11/14 2256 05/12/14 0255 05/12/14 0757  GLUCAP 126* 240* 385* 286*    No results found for this or any previous visit (from the past 240 hour(s)).   Studies: Dg Chest 2 View  05/11/2014   CLINICAL DATA:  Cough and wheezing for 3 weeks.  EXAM: CHEST  2 VIEW  COMPARISON:  PA and lateral chest 10/30/2013.  FINDINGS: Lungs are clear. Heart size is normal. There is no pneumothorax or pleural effusion.  IMPRESSION: No acute disease.   Electronically Signed   By: Drusilla Kannerhomas  Dalessio  M.D.   On: 05/11/2014 15:12   Ct Angio Chest W/cm &/or Wo Cm  05/11/2014   CLINICAL DATA:  Shortness of breath and leg pain. Evaluate for pulmonary embolus.  EXAM: CT ANGIOGRAPHY CHEST WITH CONTRAST  TECHNIQUE: Multidetector CT imaging of the chest was performed using the standard protocol during bolus administration of intravenous contrast. Multiplanar CT image reconstructions and MIPs were obtained to evaluate the vascular anatomy.  CONTRAST:  100mL OMNIPAQUE IOHEXOL 350 MG/ML SOLN  COMPARISON:  None.  FINDINGS: No pulmonary embolus. No pathologically enlarged mediastinal, hilar or axillary lymph nodes. Atherosclerotic calcification of the arterial vasculature, including coronary arteries. Heart is at the upper limits of normal in size to mildly enlarged. No pericardial effusion.  Lungs are clear.  No pleural fluid.  Airway is unremarkable.  Incidental imaging of the upper abdomen shows marked low-attenuation with throughout the visualized portion of the liver. Visualized portions of the right adrenal gland, spleen and stomach are grossly unremarkable. No worrisome lytic or sclerotic lesions. Degenerative changes are seen in the spine.  Review of the MIP images confirms the above findings.  IMPRESSION: 1. Negative for pulmonary embolus. 2. Coronary artery calcification. 3. Hepatic steatosis.   Electronically Signed   By: Leanna BattlesMelinda  Blietz M.D.   On: 05/11/2014 17:32   [place] Venous Img Lower Unilateral Left  05/11/2014   CLINICAL DATA:  Left leg swelling, reported history of prior DVT  EXAM: LEFT LOWER EXTREMITY VENOUS DOPPLER ULTRASOUND  TECHNIQUE: Gray-scale sonography with graded compression, as well as color Doppler and duplex ultrasound were performed to evaluate the lower extremity deep venous systems from the level of the common femoral vein and including the common femoral, femoral, profunda femoral, popliteal and calf veins including the posterior tibial, peroneal and gastrocnemius veins when visible. The  superficial great saphenous vein was also interrogated. Spectral Doppler was utilized to evaluate flow at rest and with distal augmentation maneuvers in the common femoral, femoral and popliteal veins.  COMPARISON:  None.  FINDINGS: Common Femoral Vein: No evidence of thrombus. Normal compressibility, respiratory phasicity and response to augmentation.  Saphenofemoral Junction: No evidence of thrombus. Normal compressibility and flow on color Doppler imaging.  Profunda Femoral Vein: No evidence of thrombus. Normal compressibility and flow on color Doppler imaging.  Femoral Vein: No evidence of thrombus. Normal compressibility, respiratory phasicity and response to augmentation.  Popliteal Vein: No evidence of thrombus. Normal compressibility, respiratory phasicity and response to augmentation.  Calf Veins: No evidence of thrombus. Normal compressibility and flow on color Doppler imaging.  Superficial Great Saphenous Vein: No evidence of thrombus. Normal compressibility and flow on color Doppler imaging.  Venous Reflux:  None.  Other Findings:  None.  IMPRESSION: No evidence of deep venous thrombosis.   Electronically Signed   By: Salome HolmesHector  Cooper M.D.   On: 05/11/2014 16:54    Scheduled Meds: .  albuterol  2.5 mg Nebulization QID  . aspirin EC  81 mg Oral QHS  . atorvastatin  10 mg Oral q1800  . cholecalciferol  1,000 Units Oral Daily  . glimepiride  2 mg Oral QAC breakfast  . heparin  5,000 Units Subcutaneous 3 times per day  . insulin aspart  0-20 Units Subcutaneous TID WC  . insulin aspart  0-5 Units Subcutaneous QHS  . insulin aspart  6 Units Subcutaneous TID WC  . insulin glargine  43 Units Subcutaneous Daily  . isosorbide mononitrate  60 mg Oral q morning - 10a  . lamoTRIgine  25 mg Oral BID  . latanoprost  1 drop Both Eyes QHS  . loratadine  10 mg Oral q morning - 10a  . magnesium oxide  400 mg Oral BID  . mometasone-formoterol  2 puff Inhalation BID  . nystatin  5 mL Oral BID  .  pantoprazole  80 mg Oral Daily  . predniSONE  60 mg Oral Q breakfast  . verapamil  240 mg Oral QHS   Continuous Infusions: . 0.9 % NaCl with KCl 40 mEq / L 100 mL/hr at 05/11/14 2311    Active Problems:   DM   HYPERTENSION   Vomiting and diarrhea   Hypokalemia   PMR (polymyalgia rheumatica)    Time spent: 35 min    Jeralyn Bennett  [facility] Pager [phone]. If 7PM-7AM, please contact night-coverage at [email or web address], password [facility] 05/12/2014, 8:37 AM  LOS: 1 day
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
## 2014-05-12 NOTE — Progress Notes (Signed)
Insulin check missed at bed time. Previous nurse took CBG and asked me to cover pt with 5 units. CBG was in upper 300's.

## 2014-05-13 DIAGNOSIS — J45909 Unspecified asthma, uncomplicated: Secondary | ICD-10-CM

## 2014-05-13 DIAGNOSIS — E119 Type 2 diabetes mellitus without complications: Secondary | ICD-10-CM

## 2014-05-13 DIAGNOSIS — J40 Bronchitis, not specified as acute or chronic: Secondary | ICD-10-CM

## 2014-05-13 DIAGNOSIS — R1310 Dysphagia, unspecified: Secondary | ICD-10-CM

## 2014-05-13 DIAGNOSIS — I10 Essential (primary) hypertension: Secondary | ICD-10-CM

## 2014-05-13 LAB — GLUCOSE, CAPILLARY
GLUCOSE-CAPILLARY: 103 mg/dL — AB (ref 70–99)
GLUCOSE-CAPILLARY: 254 mg/dL — AB (ref 70–99)
Glucose-Capillary: 309 mg/dL — ABNORMAL HIGH (ref 70–99)
Glucose-Capillary: 188 mg/dL — ABNORMAL HIGH (ref 70–99)

## 2014-05-13 LAB — BASIC METABOLIC PANEL
BUN: 8 mg/dL (ref 6–23)
CALCIUM: 7.8 mg/dL — AB (ref 8.4–10.5)
CO2: 23 meq/L (ref 19–32)
Chloride: 109 mEq/L (ref 96–112)
Creatinine, Ser: 0.67 mg/dL (ref 0.50–1.10)
GFR calc Af Amer: >90 mL/min (ref >90)
GFR, EST NON AFRICAN AMERICAN: 88 mL/min — AB (ref >90)
GLUCOSE: 136 mg/dL — AB (ref 70–99)
Potassium: 4.5 mEq/L (ref 3.7–5.3)
Sodium: 146 mEq/L (ref 137–147)

## 2014-05-13 LAB — CORTISOL-AM, BLOOD: CORTISOL - AM: 12.9 ug/dL (ref 4.3–22.4)

## 2014-05-13 LAB — CBC
HCT: 36.2 % (ref 36.0–46.0)
Hemoglobin: 12.3 g/dL (ref 12.0–15.0)
MCH: 32.6 pg (ref 26.0–34.0)
MCHC: 34 g/dL (ref 30.0–36.0)
MCV: 96 fL (ref 78.0–100.0)
PLATELETS: 249 10*3/uL (ref 150–400)
RBC: 3.77 MIL/uL — ABNORMAL LOW (ref 3.87–5.11)
RDW: 16.8 % — AB (ref 11.5–15.5)
WBC: 13.9 10*3/uL — ABNORMAL HIGH (ref 4.0–10.5)

## 2014-05-13 MED ORDER — HYDROCODONE-HOMATROPINE 5-1.5 MG/5ML PO SYRP: 5.0000 mL | ORAL_SOLUTION | Freq: Four times a day (QID) | ORAL | Status: DC | PRN

## 2014-05-13 MED ORDER — RISAQUAD PO CAPS
1.0000 | ORAL_CAPSULE | Freq: Every day | ORAL | Status: DC
  Administered 2014-05-13 – 2014-05-15 (×3): 1 via ORAL
  Filled 2014-05-13 (×3): qty 1

## 2014-05-13 MED ORDER — LEVALBUTEROL HCL 0.63 MG/3ML IN NEBU
0.6300 mg | INHALATION_SOLUTION | RESPIRATORY_TRACT | Status: DC
  Administered 2014-05-13 – 2014-05-15 (×10): 0.63 mg via RESPIRATORY_TRACT
  Filled 2014-05-13 (×10): qty 3

## 2014-05-13 MED ORDER — INSULIN ASPART 100 UNIT/ML ~~LOC~~ SOLN
12.0000 [IU] | Freq: Three times a day (TID) | SUBCUTANEOUS | Status: DC
  Administered 2014-05-14 (×2): 12 [IU] via SUBCUTANEOUS

## 2014-05-13 MED ORDER — INSULIN GLARGINE 100 UNIT/ML ~~LOC~~ SOLN
10.0000 [IU] | Freq: Once | SUBCUTANEOUS | Status: AC
  Administered 2014-05-13: 10 [IU] via SUBCUTANEOUS
  Filled 2014-05-13: qty 0.1

## 2014-05-13 MED ORDER — GUAIFENESIN ER 600 MG PO TB12
1200.0000 mg | ORAL_TABLET | Freq: Two times a day (BID) | ORAL | Status: DC
  Administered 2014-05-13 – 2014-05-15 (×4): 1200 mg via ORAL
  Filled 2014-05-13 (×4): qty 2

## 2014-05-13 MED ORDER — PREDNISONE 20 MG PO TABS
50.0000 mg | ORAL_TABLET | Freq: Every day | ORAL | Status: DC
  Administered 2014-05-14 – 2014-05-15 (×2): 50 mg via ORAL
  Filled 2014-05-13 (×2): qty 1
  Filled 2014-05-13 (×2): qty 2

## 2014-05-13 MED ORDER — INSULIN GLARGINE 100 UNIT/ML ~~LOC~~ SOLN
30.0000 [IU] | Freq: Every day | SUBCUTANEOUS | Status: DC
  Administered 2014-05-14: 30 [IU] via SUBCUTANEOUS
  Filled 2014-05-13 (×3): qty 0.3

## 2014-05-13 MED ORDER — LATANOPROST 0.005 % OP SOLN
1.0000 [drp] | Freq: Two times a day (BID) | OPHTHALMIC | Status: DC
  Administered 2014-05-13 – 2014-05-15 (×5): 1 [drp] via OPHTHALMIC
  Filled 2014-05-13: qty 2.5

## 2014-05-13 MED ORDER — CALCIUM POLYCARBOPHIL 625 MG PO TABS
1250.0000 mg | ORAL_TABLET | Freq: Every day | ORAL | Status: DC
  Administered 2014-05-13 – 2014-05-15 (×3): 1250 mg via ORAL
  Filled 2014-05-13 (×4): qty 2

## 2014-05-13 NOTE — Progress Notes (Addendum)
[facility] PROGRESS NOTE  Kelli Stewart RVI:[id number] DOB: 1946/01/16 DOA: 05/11/2014 PCP: [name], MD  Assessment/Plan: 1. Suspected adrenal insufficiency. Patient is chronically on prednisone for polymyalgia rheumatica. Her dose was suddenly decreased several weeks ago. She reports taking between 5 and 10 mg daily for many years. She was given a dose of Solu-Medrol in the emergency room had improvement in her symptoms. Currently on prednisone 60 mg by mouth daily. Will decrease to 50 mg daily 2. Nausea/vomiting/diarrhea. Possibly related to #1. CT of the abdomen and pelvis was unremarkable. Stool studies are currently in process. C. difficile was negative. Appreciate GI systems. 3. Exacerbation of asthma. Continue bronchodilators and steroids. She is clinically improving. Continue pulmonary hygiene 4. Diabetes. Currently on Lantus and meal coverage. Fasting sugar is 103. Sugars throughout the day her range. She'll need more meal coverage. 5. Polymyalgia rheumatica. Continue on steroids. 6. Hypokalemia. Improved with replacement. 7. Leukocytosis. Likely related to steroids. 8. Tachycardia. Suspect this is related to bronchodilators versus underlying pulmonary process. CT scan of the chest was negative for PE. We'll change albuterol to Xopenex 9. Solid food dysphagia. Plans are for EGD with likely dilatation per GI.  Code Status: full code Family Communication: discussed with patient and family at the bedside Disposition Plan: discharge home once improved.   Consultants:  Gastroenterology  Procedures:    Antibiotics:    HPI/Subjective: No further vomiting.  Continues to have loose stools.  Had 4 BM today. Has some lower abdominal pain. Still short of breath and wheezing, but feels she is doing better. Has coughing spells.  Objective: Filed Vitals:   05/13/14 1430  BP: 102/64  Pulse: 117  Temp: 97.8 F (36.6 C)  Resp: 20    Intake/Output Summary (Last 24  hours) at 05/13/14 1822 Last data filed at 05/13/14 1751  Gross per 24 hour  Intake    360 ml  Output   1000 ml  Net   -640 ml   Filed Weights   05/11/14 1248 05/11/14 2233 05/13/14 1500  Weight: 87.091 kg (192 lb) 87.091 kg (192 lb) 90.765 kg (200 lb 1.6 oz)    Exam:   General:  No acute distress  Cardiovascular: S1, S2, tachycardic  Respiratory: Diminished breath sounds with mild expiratory wheeze  Abdomen: Soft, nontender, positive bowel sounds  Musculoskeletal: 1+ pitting edema bilaterally   Data Reviewed: Basic Metabolic Panel:  Recent Labs Lab 05/11/14 1320 05/12/14 0549 05/13/14 0601  NA 141 138 146  K 2.9* 3.5* 4.5  CL 95* 100 109  CO2 30 24 23   GLUCOSE 124* 385* 136*  BUN 13 12 8   CREATININE 0.82 0.72 0.67  CALCIUM 9.0 7.6* 7.8*   Liver Function Tests:  Recent Labs Lab 05/11/14 1320 05/12/14 0549  AST 37 27  ALT 26 22  ALKPHOS 65 50  BILITOT 0.9 0.5  PROT 6.9 5.6*  ALBUMIN 3.1* 2.5*   No results found for this basename: LIPASE, AMYLASE,  in the last 168 hours No results found for this basename: AMMONIA,  in the last 168 hours CBC:  Recent Labs Lab 05/11/14 1320 05/12/14 0549 05/13/14 0601  WBC 9.2 6.9 13.9*  NEUTROABS 6.5  --   --   HGB 14.1 11.8* 12.3  HCT 41.8 34.9* 36.2  MCV 95.0 94.1 96.0  PLT 276 202 249   Cardiac Enzymes: No results found for this basename: CKTOTAL, CKMB, CKMBINDEX, TROPONINI,  in the last 168 hours BNP (last 3 results)  Recent Labs  10/30/13 1236  PROBNP 75.7   CBG:  Recent Labs Lab 05/12/14 1639 05/12/14 2117 05/13/14 0732 05/13/14 1207 05/13/14 1648  GLUCAP 288* 306* 103* 254* 309*    Recent Results (from the past 240 hour(s))  CLOSTRIDIUM DIFFICILE BY PCR     Status: None   Collection Time    05/12/14 11:08 AM      Result Value Ref Range Status   C difficile by pcr NEGATIVE  NEGATIVE Final     Studies: Ct Abdomen Pelvis W Contrast  05/12/2014   CLINICAL DATA:  Nausea, vomiting and  diarrhea for the past 2 days. Abdominal pain all over.  EXAM: CT ABDOMEN AND PELVIS WITH CONTRAST  TECHNIQUE: Multidetector CT imaging of the abdomen and pelvis was performed using the standard protocol following bolus administration of intravenous contrast.  CONTRAST:  50mL OMNIPAQUE IOHEXOL 300 MG/ML SOLN, 100mL OMNIPAQUE IOHEXOL 300 MG/ML SOLN  COMPARISON:  No priors.  FINDINGS: Lung Bases: Unremarkable.  Abdomen/Pelvis: Diffuse low attenuation throughout the hepatic parenchyma, compatible with hepatic steatosis. No focal cystic or solid hepatic lesions. Status post cholecystectomy. The appearance of the pancreas, spleen, bilateral adrenal glands and bilateral kidneys is unremarkable. No significant volume of ascites. No pneumoperitoneum. No pathologic distention of small bowel. No lymphadenopathy identified within the abdomen or pelvis. Atherosclerosis throughout the abdominal and pelvic vasculature, without evidence of aneurysm or dissection. Status post hysterectomy. Ovaries are atrophic. Urinary bladder is normal in appearance.  Musculoskeletal: There are no aggressive appearing lytic or blastic lesions noted in the visualized portions of the skeleton.  IMPRESSION: 1. No acute findings in the abdomen or pelvis to account for the patient's symptoms. 2. Hepatic steatosis. 3. Status post cholecystectomy and hysterectomy. 4. Atherosclerosis.   Electronically Signed   By: Trudie Reedaniel  Entrikin M.D.   On: 05/12/2014 17:28    Scheduled Meds: . acidophilus  1 capsule Oral Daily  . albuterol  2.5 mg Nebulization QID  . aspirin EC  81 mg Oral QHS  . atorvastatin  10 mg Oral q1800  . cholecalciferol  1,000 Units Oral Daily  . heparin  5,000 Units Subcutaneous 3 times per day  . insulin aspart  0-20 Units Subcutaneous TID WC  . insulin aspart  0-5 Units Subcutaneous QHS  . insulin aspart  6 Units Subcutaneous TID WC  . [START ON 05/14/2014] insulin glargine  30 Units Subcutaneous Daily  . isosorbide mononitrate   60 mg Oral q morning - 10a  . lamoTRIgine  25 mg Oral BID  . latanoprost  1 drop Both Eyes BID  . loratadine  10 mg Oral q morning - 10a  . magnesium oxide  400 mg Oral BID  . mometasone-formoterol  2 puff Inhalation BID  . nystatin  5 mL Oral BID  . ondansetron (ZOFRAN) IV  4 mg Intravenous TID WC & HS  . pantoprazole  40 mg Oral BID AC  . polycarbophil  1,250 mg Oral Daily  . predniSONE  60 mg Oral Q breakfast  . verapamil  240 mg Oral QHS   Continuous Infusions: . 0.9 % NaCl with KCl 40 mEq / L 100 mL/hr at 05/13/14 [id number]    Active Problems:   DM   HYPERTENSION   Vomiting and diarrhea   Hypokalemia   PMR (polymyalgia rheumatica)   Dysphagia    Time spent: 35mins    [facility] Eye Institute LLCJehanzeb Lenny Bouchillon  [facility] Pager [phone]. If 7PM-7AM, please contact night-coverage at [email or web address], password [email or web address] 05/13/2014, 6:22 PM  LOS: 2 days

## 2014-05-13 NOTE — Progress Notes (Addendum)
Subjective: Since I last evaluated the patient NAUSEA/VOMTING CONTROLLED. STILL HAVING DIARRHEA. TOLERATING POs. HAD A BAD ASTHMA ATTACK THIS AM.  Objective: Vital signs in last 24 hours: Temp:  [97.5 F (36.4 C)-98.3 F (36.8 C)] 98.3 F (36.8 C) (06/07 0437) Pulse Rate:  [108-126] 108 (06/07 0437) Resp:  [20] 20 (06/07 0437) BP: (100-122)/(60-76) 122/76 mmHg (06/07 0437) SpO2:  [92 %-100 %] 97 % (06/07 0952) Last BM Date: 05/12/14  Intake/Output from previous day: 06/06 0701 - 06/07 0700 In: 2408.3 [P.O.:720; I.V.:1688.3] Out: 600 [Urine:600] Intake/Output this shift:    General appearance: alert, cooperative and no distress Resp: clear to auscultation bilaterally WITH OCCASIONAL END EXP WHEEZE AND COARSE BS BILATERALLY. Cardio: regular rate and rhythm GI: soft, non-tender; bowel sounds normal;   Lab Results:  Recent Labs  05/11/14 1320 05/12/14 0549 05/13/14 0601  WBC 9.2 6.9 13.9*  HGB 14.1 11.8* 12.3  HCT 41.8 34.9* 36.2  PLT 276 202 249   BMET  Recent Labs  05/11/14 1320 05/12/14 0549 05/13/14 0601  NA 141 138 146  K 2.9* 3.5* 4.5  CL 95* 100 109  CO2 30 24 23   GLUCOSE 124* 385* 136*  BUN 13 12 8   CREATININE 0.82 0.72 0.67  CALCIUM 9.0 7.6* 7.8*   LFT  Recent Labs  05/12/14 0549  PROT 5.6*  ALBUMIN 2.5*  AST 27  ALT 22  ALKPHOS 50  BILITOT 0.5   PT/INR  Recent Labs  05/12/14 0549  LABPROT 14.7  INR 1.17   CORTISOL 1238P JUN 5: 12.9  C-Diff pcr: NEG  Studies/Results: Dg Chest 2 View  05/11/2014   CLINICAL DATA:  Cough and wheezing for 3 weeks.  EXAM: CHEST  2 VIEW  COMPARISON:  PA and lateral chest 10/30/2013.  FINDINGS: Lungs are clear. Heart size is normal. There is no pneumothorax or pleural effusion.  IMPRESSION: No acute disease.   Electronically Signed   By: Drusilla Kanner M.D.   On: 05/11/2014 15:12   Ct Angio Chest W/cm &/or Wo Cm  05/11/2014   CLINICAL DATA:  Shortness of breath and leg pain. Evaluate for pulmonary  embolus.  EXAM: CT ANGIOGRAPHY CHEST WITH CONTRAST  TECHNIQUE: Multidetector CT imaging of the chest was performed using the standard protocol during bolus administration of intravenous contrast. Multiplanar CT image reconstructions and MIPs were obtained to evaluate the vascular anatomy.  CONTRAST:  OMNIPAQUE IOHEXOL 350 MG/ML SOLN  COMPARISON:  None.  FINDINGS: No pulmonary embolus. No pathologically enlarged mediastinal, hilar or axillary lymph nodes. Atherosclerotic calcification of the arterial vasculature, including coronary arteries. Heart is at the upper limits of normal in size to mildly enlarged. No pericardial effusion.  Lungs are clear.  No pleural fluid.  Airway is unremarkable.  Incidental imaging of the upper abdomen shows marked low-attenuation with throughout the visualized portion of the liver. Visualized portions of the right adrenal gland, spleen and stomach are grossly unremarkable. No worrisome lytic or sclerotic lesions. Degenerative changes are seen in the spine.  Review of the MIP images confirms the above findings.  IMPRESSION: 1. Negative for pulmonary embolus. 2. Coronary artery calcification. 3. Hepatic steatosis.   Electronically Signed   By: Leanna Battles M.D.   On: 05/11/2014 17:32   Ct Abdomen Pelvis W Contrast  05/12/2014   CLINICAL DATA:  Nausea, vomiting and diarrhea for the past 2 days. Abdominal pain all over.  EXAM: CT ABDOMEN AND PELVIS WITH CONTRAST  TECHNIQUE: Multidetector CT imaging of the abdomen and  pelvis was performed using the standard protocol following bolus administration of intravenous contrast.  CONTRAST:  50mL OMNIPAQUE IOHEXOL 300 MG/ML SOLN, 100mL OMNIPAQUE IOHEXOL 300 MG/ML SOLN  COMPARISON:  No priors.  FINDINGS: Lung Bases: Unremarkable.  Abdomen/Pelvis: Diffuse low attenuation throughout the hepatic parenchyma, compatible with hepatic steatosis. No focal cystic or solid hepatic lesions. Status post cholecystectomy. The appearance of the pancreas,  spleen, bilateral adrenal glands and bilateral kidneys is unremarkable. No significant volume of ascites. No pneumoperitoneum. No pathologic distention of small bowel. No lymphadenopathy identified within the abdomen or pelvis. Atherosclerosis throughout the abdominal and pelvic vasculature, without evidence of aneurysm or dissection. Status post hysterectomy. Ovaries are atrophic. Urinary bladder is normal in appearance.  Musculoskeletal: There are no aggressive appearing lytic or blastic lesions noted in the visualized portions of the skeleton.  IMPRESSION: 1. No acute findings in the abdomen or pelvis to account for the patient's symptoms. 2. Hepatic steatosis. 3. Status post cholecystectomy and hysterectomy. 4. Atherosclerosis.   Electronically Signed   By: Trudie Reedaniel  Entrikin M.D.   On: 05/12/2014 17:28   [place] Venous Img Lower Unilateral Left  05/11/2014   CLINICAL DATA:  Left leg swelling, reported history of prior DVT  EXAM: LEFT LOWER EXTREMITY VENOUS DOPPLER ULTRASOUND  TECHNIQUE: Gray-scale sonography with graded compression, as well as color Doppler and duplex ultrasound were performed to evaluate the lower extremity deep venous systems from the level of the common femoral vein and including the common femoral, femoral, profunda femoral, popliteal and calf veins including the posterior tibial, peroneal and gastrocnemius veins when visible. The superficial great saphenous vein was also interrogated. Spectral Doppler was utilized to evaluate flow at rest and with distal augmentation maneuvers in the common femoral, femoral and popliteal veins.  COMPARISON:  None.  FINDINGS: Common Femoral Vein: No evidence of thrombus. Normal compressibility, respiratory phasicity and response to augmentation.  Saphenofemoral Junction: No evidence of thrombus. Normal compressibility and flow on color Doppler imaging.  Profunda Femoral Vein: No evidence of thrombus. Normal compressibility and flow on color Doppler imaging.   Femoral Vein: No evidence of thrombus. Normal compressibility, respiratory phasicity and response to augmentation.  Popliteal Vein: No evidence of thrombus. Normal compressibility, respiratory phasicity and response to augmentation.  Calf Veins: No evidence of thrombus. Normal compressibility and flow on color Doppler imaging.  Superficial Great Saphenous Vein: No evidence of thrombus. Normal compressibility and flow on color Doppler imaging.  Venous Reflux:  None.  Other Findings:  None.  IMPRESSION: No evidence of deep venous thrombosis.   Electronically Signed   By: Salome HolmesHector  Cooper M.D.   On: 05/11/2014 16:54    Medications: I have reviewed the patient's current medications.  Assessment/Plan: ADMITTED WITH ASTHMA EXACERBATION, ? ADRENAL INSUFFICIENCY, SOLID DYSPHAGIA, AND DIARRHEA. CLINICALLY IMPROVED.   PLAN: 1. EGD/DIL JUN 9. NPO AFTER MN EXCEPT MEDS. WILL NEED INSULIN ADJUSTED. 2. INCREASE LANTUS TO 30 U AT 1000 3. BID PPI 4. ADVANCE TO SOFT MECH/CARB MOD DIET. PT SHOULD NOT EAT CHUNKS OF ANYTHING. 5. ADD FIBER PILLS AND PROBIOTIC.      LOS: 2 days   Cole Klugh L Kiaya Haliburton 05/13/2014, 10:56 AM

## 2014-05-13 NOTE — Progress Notes (Signed)
Patient's dose of Lantus increased from 20 units to 30 units today.  Patient had already received 20 units of Lantus this morning.  Dr. Darrick Penna gave order for patient to receive the remaining 10 units of Lantus insulin today.  Orders followed.

## 2014-05-14 DIAGNOSIS — E2749 Other adrenocortical insufficiency: Principal | ICD-10-CM

## 2014-05-14 DIAGNOSIS — R1110 Vomiting, unspecified: Secondary | ICD-10-CM

## 2014-05-14 LAB — BASIC METABOLIC PANEL
BUN: 9 mg/dL (ref 6–23)
CO2: 25 mEq/L (ref 19–32)
CREATININE: 0.64 mg/dL (ref 0.50–1.10)
Calcium: 7.5 mg/dL — ABNORMAL LOW (ref 8.4–10.5)
Chloride: 110 mEq/L (ref 96–112)
GFR calc non Af Amer: 90 mL/min — ABNORMAL LOW (ref >90)
Glucose, Bld: 131 mg/dL — ABNORMAL HIGH (ref 70–99)
Potassium: 4.1 mEq/L (ref 3.7–5.3)
Sodium: 147 mEq/L (ref 137–147)

## 2014-05-14 LAB — CBC
HCT: 33.8 % — ABNORMAL LOW (ref 36.0–46.0)
Hemoglobin: 11 g/dL — ABNORMAL LOW (ref 12.0–15.0)
MCH: 31.9 pg (ref 26.0–34.0)
MCHC: 32.5 g/dL (ref 30.0–36.0)
MCV: 98 fL (ref 78.0–100.0)
PLATELETS: 202 10*3/uL (ref 150–400)
RBC: 3.45 MIL/uL — ABNORMAL LOW (ref 3.87–5.11)
RDW: 17.4 % — AB (ref 11.5–15.5)
WBC: 10.5 10*3/uL (ref 4.0–10.5)

## 2014-05-14 LAB — GI PATHOGEN PANEL BY PCR, STOOL
C difficile toxin A/B: NEGATIVE
Campylobacter by PCR: NEGATIVE
Cryptosporidium by PCR: NEGATIVE
E COLI (ETEC) LT/ST: NEGATIVE
E COLI (STEC): NEGATIVE
E coli 0157 by PCR: NEGATIVE
G lamblia by PCR: NEGATIVE
Norovirus GI/GII: NEGATIVE
ROTAVIRUS A BY PCR: NEGATIVE
SHIGELLA BY PCR: NEGATIVE
Salmonella by PCR: NEGATIVE

## 2014-05-14 LAB — GLUCOSE, CAPILLARY
GLUCOSE-CAPILLARY: 103 mg/dL — AB (ref 70–99)
GLUCOSE-CAPILLARY: 315 mg/dL — AB (ref 70–99)
Glucose-Capillary: 120 mg/dL — ABNORMAL HIGH (ref 70–99)
Glucose-Capillary: 155 mg/dL — ABNORMAL HIGH (ref 70–99)

## 2014-05-14 MED ORDER — BENZONATATE 100 MG PO CAPS: 100.0000 mg | ORAL_CAPSULE | Freq: Three times a day (TID) | ORAL | Status: DC | PRN

## 2014-05-14 MED ORDER — SODIUM CHLORIDE 0.9 % IV SOLN
INTRAVENOUS | Status: DC
  Administered 2014-05-15: 1000 mL via INTRAVENOUS

## 2014-05-14 MED ORDER — ALBUTEROL SULFATE (2.5 MG/3ML) 0.083% IN NEBU: 2.5000 mg | INHALATION_SOLUTION | RESPIRATORY_TRACT | Status: DC | PRN

## 2014-05-14 NOTE — Progress Notes (Signed)
Subjective: Mild RLQ discomfort with loose stool. Just had diarrhea. Has had about 6 loose stools overnight. Tolerating diet. No further N/V. Intermittent dysphagia. Last colonoscopy about 3 years ago possibly by Dr. Allena Katz, reportedly normal. Baseline bowel habits are soft, like "baby poop", 3-4 times per day.   Objective: Vital signs in last 24 hours: Temp:  [97.8 F (36.6 C)-98.1 F (36.7 C)] 98.1 F (36.7 C) (06/08 0634) Pulse Rate:  [102-120] 120 (06/08 0634) Resp:  [20] 20 (06/08 0634) BP: (102-131)/(63-73) 131/73 mmHg (06/08 0634) SpO2:  [94 %-100 %] 98 % (06/08 0738) Weight:  [199 lb (90.266 kg)-200 lb 1.6 oz (90.765 kg)] 199 lb (90.266 kg) (06/08 0500) Last BM Date: 05/13/14 General:   Alert and oriented, pleasant Head:  Normocephalic and atraumatic. Abdomen:  Bowel sounds present, soft, very mild TTP RLQ, non-distended. No HSM or hernias noted. Extremities:  Without  edema. Neurologic:  Alert and  oriented x4;  grossly normal neurologically. Skin:  Warm and dry, intact without significant lesions.  Psych:  Alert and cooperative. Normal mood and affect.  Intake/Output from previous day: 06/07 0701 - 06/08 0700 In: 360 [P.O.:360] Out: 400 [Urine:400] Intake/Output this shift:    Lab Results:  Recent Labs  05/12/14 0549 05/13/14 0601 05/14/14 0727  WBC 6.9 13.9* 10.5  HGB 11.8* 12.3 11.0*  HCT 34.9* 36.2 33.8*  PLT 202 249 202   BMET  Recent Labs  05/12/14 0549 05/13/14 0601 05/14/14 0727  NA 138 146 147  K 3.5* 4.5 4.1  CL 100 109 110  CO2 24 23 25   GLUCOSE 385* 136* 131*  BUN 12 8 9   CREATININE 0.72 0.67 0.64  CALCIUM 7.6* 7.8* 7.5*   LFT  Recent Labs  05/11/14 1320 05/12/14 0549  PROT 6.9 5.6*  ALBUMIN 3.1* 2.5*  AST 37 27  ALT 26 22  ALKPHOS 65 50  BILITOT 0.9 0.5   PT/INR  Recent Labs  05/12/14 0549  LABPROT 14.7  INR 1.17   Studies/Results: Ct Abdomen Pelvis W Contrast  05/12/2014   CLINICAL DATA:  Nausea, vomiting and  diarrhea for the past 2 days. Abdominal pain all over.  EXAM: CT ABDOMEN AND PELVIS WITH CONTRAST  TECHNIQUE: Multidetector CT imaging of the abdomen and pelvis was performed using the standard protocol following bolus administration of intravenous contrast.  CONTRAST:  51mL OMNIPAQUE IOHEXOL 300 MG/ML SOLN, OMNIPAQUE IOHEXOL 300 MG/ML SOLN  COMPARISON:  No priors.  FINDINGS: Lung Bases: Unremarkable.  Abdomen/Pelvis: Diffuse low attenuation throughout the hepatic parenchyma, compatible with hepatic steatosis. No focal cystic or solid hepatic lesions. Status post cholecystectomy. The appearance of the pancreas, spleen, bilateral adrenal glands and bilateral kidneys is unremarkable. No significant volume of ascites. No pneumoperitoneum. No pathologic distention of small bowel. No lymphadenopathy identified within the abdomen or pelvis. Atherosclerosis throughout the abdominal and pelvic vasculature, without evidence of aneurysm or dissection. Status post hysterectomy. Ovaries are atrophic. Urinary bladder is normal in appearance.  Musculoskeletal: There are no aggressive appearing lytic or blastic lesions noted in the visualized portions of the skeleton.  IMPRESSION: 1. No acute findings in the abdomen or pelvis to account for the patient's symptoms. 2. Hepatic steatosis. 3. Status post cholecystectomy and hysterectomy. 4. Atherosclerosis.   Electronically Signed   By: Trudie Reed M.D.   On: 05/12/2014 17:28    Assessment: Admitted with N/V/D in the setting of possible adrenal insufficiency, with CT without acute findings, GI pathogen panel pending. Overall, clinically improved from  N/V standpoint but persistent loose stool.  Solid food dysphagia but tolerating soft diet/carb modified. Plans for EGD and dilation June 9th.    Plan: EGD with dilation on 6/9 with Dr. Darrick PennaFields  BID PPI  Follow-up on pending stool studies; continue supportive measures Soft mechanical diet, with full liquid diet to  begin this evening NPO after midnight Hold heparin dose this evening and until after procedure tomorrow   Nira RetortAnna W. Sams, ANP-BC [facility]     LOS: 3 days    05/14/2014, 8:08 AM  Attending note: Patient seen and examined. Agree with plans for EGD tomorrow. Heparin held.

## 2014-05-14 NOTE — Care Management Utilization Note (Signed)
UR completed 

## 2014-05-14 NOTE — Progress Notes (Signed)
[facility] PROGRESS NOTE  EMBYR SIM DJS:[id number] DOB: 1946/07/05 DOA: 05/11/2014 PCP: [name], MD  Assessment/Plan: 1. Suspected Adrenal Insufficiency -She reports previously taking Prednisone 30 mg PO q daily, which was cut down to 5 mg PO q daily, after which she started experiencing nausea/vomiting 3 weeks ago. -Has not had further episodes of nausea and vomiting since admission tolerating PO intake. Will continue Prednisone at 50 mg PO q daily  -Will likely need a slow steroid taper.  2. Nausea/Vomiting/Diarrhea.  -As mentioned above, could be related to adrenal insufficiency, with a sharp decrease in her steroid dose.  -CT scan of abdomen and pelvis done on 05/12/2014 was negative  -Labs otherwise showing Zabriskie count of 6.9 and has unremarkable abdominal exam  3. Asthma -Continues to have cough and wheezing, will continue Prednisone therapy, nebs and inhaled steroids.   -CT scan of lungs did not reveal infiltrate  4. Diabetes Mellitus -Blood sugars going up likely from steroid therapy, better controlled this morning with BS of 120.  -accuchecks ac and hs with sliding scale coverage -Continue Glargine 30 units [place] q daily  5. PMR -Stable, on steroids -Will need slow taper  6. Dysphagia -GI consulted, plan for EGD tomorrow morning.    Code Status: Full Code Family Communication:  Disposition Plan: Continue supportive care, IV fluids, steroids, pending CT of abdomen    HPI/Subjective: Patient is a 68 year old female with a past medical history of PMR, on chronic steroid therapy, admitted overnight for nausea/vomiting/diarrhea/dehydration. She reporting taking 30 mg of Prednisone daily which was cut down to 5 mg PO daily after which symptoms started.    Objective: Filed Vitals:   05/14/14 0634  BP: 131/73  Pulse: 120  Temp: 98.1 F (36.7 C)  Resp: 20    Intake/Output Summary (Last 24 hours) at 05/14/14 0839 Last data filed at 05/13/14 1751   Gross per 24 hour  Intake    360 ml  Output    400 ml  Net    -40 ml   Filed Weights   05/11/14 2233 05/13/14 1500 05/14/14 0500  Weight: 87.091 kg (192 lb) 90.765 kg (200 lb 1.6 oz) 90.266 kg (199 lb)    Exam:   General:  No acute distress, she is awake and alert  Cardiovascular: Regular rate and rhythm, normal S1S2  Respiratory: Diminished breath sounds, with bilateral expiratory wheezes  Abdomen: Soft nontender nondistended  Musculoskeletal: No edema  Data Reviewed: Basic Metabolic Panel:  Recent Labs Lab 05/11/14 1320 05/12/14 0549 05/13/14 0601 05/14/14 0727  NA 141 138 146 147  K 2.9* 3.5* 4.5 4.1  CL 95* 100 109 110  CO2 30 24 23 25   GLUCOSE 124* 385* 136* 131*  BUN 13 12 8 9   CREATININE 0.82 0.72 0.67 0.64  CALCIUM 9.0 7.6* 7.8* 7.5*   Liver Function Tests:  Recent Labs Lab 05/11/14 1320 05/12/14 0549  AST 37 27  ALT 26 22  ALKPHOS 65 50  BILITOT 0.9 0.5  PROT 6.9 5.6*  ALBUMIN 3.1* 2.5*   No results found for this basename: LIPASE, AMYLASE,  in the last 168 hours No results found for this basename: AMMONIA,  in the last 168 hours CBC:  Recent Labs Lab 05/11/14 1320 05/12/14 0549 05/13/14 0601 05/14/14 0727  WBC 9.2 6.9 13.9* 10.5  NEUTROABS 6.5  --   --   --   HGB 14.1 11.8* 12.3 11.0*  HCT 41.8 34.9* 36.2 33.8*  MCV 95.0 94.1 96.0 98.0  PLT 276 202 249 202   Cardiac Enzymes: No results found for this basename: CKTOTAL, CKMB, CKMBINDEX, TROPONINI,  in the last 168 hours BNP (last 3 results)  Recent Labs  10/30/13 1236  PROBNP 75.7   CBG:  Recent Labs Lab 05/13/14 0732 05/13/14 1207 05/13/14 1648 05/13/14 2054 05/14/14 0748  GLUCAP 103* 254* 309* 188* 120*    Recent Results (from the past 240 hour(s))  CLOSTRIDIUM DIFFICILE BY PCR     Status: None   Collection Time    05/12/14 11:08 AM      Result Value Ref Range Status   C difficile by pcr NEGATIVE  NEGATIVE Final     Studies: Ct Abdomen Pelvis W  Contrast  05/12/2014   CLINICAL DATA:  Nausea, vomiting and diarrhea for the past 2 days. Abdominal pain all over.  EXAM: CT ABDOMEN AND PELVIS WITH CONTRAST  TECHNIQUE: Multidetector CT imaging of the abdomen and pelvis was performed using the standard protocol following bolus administration of intravenous contrast.  CONTRAST:  50mL OMNIPAQUE IOHEXOL 300 MG/ML SOLN, OMNIPAQUE IOHEXOL 300 MG/ML SOLN  COMPARISON:  No priors.  FINDINGS: Lung Bases: Unremarkable.  Abdomen/Pelvis: Diffuse low attenuation throughout the hepatic parenchyma, compatible with hepatic steatosis. No focal cystic or solid hepatic lesions. Status post cholecystectomy. The appearance of the pancreas, spleen, bilateral adrenal glands and bilateral kidneys is unremarkable. No significant volume of ascites. No pneumoperitoneum. No pathologic distention of small bowel. No lymphadenopathy identified within the abdomen or pelvis. Atherosclerosis throughout the abdominal and pelvic vasculature, without evidence of aneurysm or dissection. Status post hysterectomy. Ovaries are atrophic. Urinary bladder is normal in appearance.  Musculoskeletal: There are no aggressive appearing lytic or blastic lesions noted in the visualized portions of the skeleton.  IMPRESSION: 1. No acute findings in the abdomen or pelvis to account for the patient's symptoms. 2. Hepatic steatosis. 3. Status post cholecystectomy and hysterectomy. 4. Atherosclerosis.   Electronically Signed   By: Trudie Reed M.D.   On: 05/12/2014 17:28    Scheduled Meds: . acidophilus  1 capsule Oral Daily  . aspirin EC  81 mg Oral QHS  . atorvastatin  10 mg Oral q1800  . cholecalciferol  1,000 Units Oral Daily  . guaiFENesin  1,200 mg Oral BID  . heparin  5,000 Units Subcutaneous 3 times per day  . insulin aspart  0-20 Units Subcutaneous TID WC  . insulin aspart  0-5 Units Subcutaneous QHS  . insulin aspart  12 Units Subcutaneous TID WC  . insulin glargine  30 Units  Subcutaneous Daily  . isosorbide mononitrate  60 mg Oral q morning - 10a  . lamoTRIgine  25 mg Oral BID  . latanoprost  1 drop Both Eyes BID  . levalbuterol  0.63 mg Nebulization Q4H  . loratadine  10 mg Oral q morning - 10a  . magnesium oxide  400 mg Oral BID  . mometasone-formoterol  2 puff Inhalation BID  . nystatin  5 mL Oral BID  . ondansetron (ZOFRAN) IV  4 mg Intravenous TID WC & HS  . pantoprazole  40 mg Oral BID AC  . polycarbophil  1,250 mg Oral Daily  . predniSONE  50 mg Oral Q breakfast  . verapamil  240 mg Oral QHS   Continuous Infusions: . 0.9 % NaCl with KCl 40 mEq / L 10 mL/hr at 05/13/14 1936    Active Problems:   DM   HYPERTENSION   Vomiting and diarrhea   Hypokalemia  PMR (polymyalgia rheumatica)   Dysphagia    Time spent: 35 min    Jeralyn BennettEzequiel Nyala Kirchner  [facility] Pager [phone]. If 7PM-7AM, please contact night-coverage at [email or web address], password [email or web address] 05/14/2014, 8:39 AM  LOS: 3 days

## 2014-05-15 ENCOUNTER — Encounter (HOSPITAL_COMMUNITY): Payer: Self-pay | Admitting: *Deleted

## 2014-05-15 ENCOUNTER — Encounter: Payer: Self-pay | Admitting: Gastroenterology

## 2014-05-15 ENCOUNTER — Telehealth: Payer: Self-pay | Admitting: Gastroenterology

## 2014-05-15 ENCOUNTER — Encounter (HOSPITAL_COMMUNITY)
Admission: EM | Disposition: A | Discharge status: Home / Self Care | Payer: Self-pay | Source: Home / Self Care | Attending: Internal Medicine

## 2014-05-15 ENCOUNTER — Inpatient Hospital Stay (HOSPITAL_COMMUNITY): Payer: Medicare Other

## 2014-05-15 DIAGNOSIS — J9801 Acute bronchospasm: Secondary | ICD-10-CM

## 2014-05-15 HISTORY — PX: MALONEY DILATION: SHX5535

## 2014-05-15 HISTORY — PX: SAVORY DILATION: SHX5439

## 2014-05-15 HISTORY — PX: ESOPHAGOGASTRODUODENOSCOPY: SHX5428

## 2014-05-15 LAB — CBC
HCT: 35.3 % — ABNORMAL LOW (ref 36.0–46.0)
Hemoglobin: 11.5 g/dL — ABNORMAL LOW (ref 12.0–15.0)
MCH: 32.3 pg (ref 26.0–34.0)
MCHC: 32.6 g/dL (ref 30.0–36.0)
MCV: 99.2 fL (ref 78.0–100.0)
PLATELETS: 203 10*3/uL (ref 150–400)
RBC: 3.56 MIL/uL — ABNORMAL LOW (ref 3.87–5.11)
RDW: 17.3 % — ABNORMAL HIGH (ref 11.5–15.5)
WBC: 10.4 10*3/uL (ref 4.0–10.5)

## 2014-05-15 LAB — GLUCOSE, CAPILLARY
GLUCOSE-CAPILLARY: 70 mg/dL (ref 70–99)
GLUCOSE-CAPILLARY: 68 mg/dL — AB (ref 70–99)
GLUCOSE-CAPILLARY: 68 mg/dL — AB (ref 70–99)
GLUCOSE-CAPILLARY: 72 mg/dL (ref 70–99)
Glucose-Capillary: 60 mg/dL — ABNORMAL LOW (ref 70–99)
Glucose-Capillary: 76 mg/dL (ref 70–99)
Glucose-Capillary: 49 mg/dL — ABNORMAL LOW (ref 70–99)
Glucose-Capillary: 89 mg/dL (ref 70–99)
Glucose-Capillary: 103 mg/dL — ABNORMAL HIGH (ref 70–99)
Glucose-Capillary: 101 mg/dL — ABNORMAL HIGH (ref 70–99)

## 2014-05-15 LAB — BASIC METABOLIC PANEL
BUN: 12 mg/dL (ref 6–23)
CALCIUM: 7.9 mg/dL — AB (ref 8.4–10.5)
CO2: 27 mEq/L (ref 19–32)
Chloride: 110 mEq/L (ref 96–112)
Creatinine, Ser: 0.68 mg/dL (ref 0.50–1.10)
GFR calc Af Amer: >90 mL/min (ref >90)
GFR calc non Af Amer: 88 mL/min — ABNORMAL LOW (ref >90)
GLUCOSE: 92 mg/dL (ref 70–99)
Potassium: 4.1 mEq/L (ref 3.7–5.3)
SODIUM: 149 meq/L — AB (ref 137–147)

## 2014-05-15 SURGERY — EGD (ESOPHAGOGASTRODUODENOSCOPY)
Anesthesia: Moderate Sedation

## 2014-05-15 MED ORDER — LIDOCAINE VISCOUS 2 % MT SOLN
OROMUCOSAL | Status: AC
  Filled 2014-05-15: qty 15

## 2014-05-15 MED ORDER — MEPERIDINE HCL 100 MG/ML IJ SOLN
INTRAMUSCULAR | Status: DC | PRN
  Administered 2014-05-15 (×4): 25 mg via INTRAVENOUS

## 2014-05-15 MED ORDER — SIMETHICONE 40 MG/0.6ML PO SUSP
ORAL | Status: DC | PRN
  Administered 2014-05-15: 15:00:00

## 2014-05-15 MED ORDER — PREDNISONE 50 MG PO TABS: ORAL_TABLET | ORAL | Status: DC

## 2014-05-15 MED ORDER — METHOCARBAMOL 750 MG PO TABS: 750.0000 mg | ORAL_TABLET | Freq: Four times a day (QID) | ORAL | Status: AC | PRN

## 2014-05-15 MED ORDER — MIDAZOLAM HCL 5 MG/5ML IJ SOLN
INTRAMUSCULAR | Status: AC
  Filled 2014-05-15: qty 10

## 2014-05-15 MED ORDER — DEXTROSE IN LACTATED RINGERS 5 % IV SOLN
INTRAVENOUS | Status: DC
  Administered 2014-05-15: 14:00:00 via INTRAVENOUS

## 2014-05-15 MED ORDER — LIDOCAINE VISCOUS 2 % MT SOLN
OROMUCOSAL | Status: DC | PRN
Start: 2014-05-15 — End: 2014-05-15
  Administered 2014-05-15: 1 via OROMUCOSAL

## 2014-05-15 MED ORDER — MONTELUKAST SODIUM 10 MG PO TABS: 10.0000 mg | ORAL_TABLET | Freq: Every day | ORAL | Status: AC

## 2014-05-15 MED ORDER — ALBUTEROL SULFATE (2.5 MG/3ML) 0.083% IN NEBU: 2.5000 mg | INHALATION_SOLUTION | RESPIRATORY_TRACT | Status: DC | PRN

## 2014-05-15 MED ORDER — MIDAZOLAM HCL 5 MG/5ML IJ SOLN
INTRAMUSCULAR | Status: DC | PRN
  Administered 2014-05-15: 2 mg via INTRAVENOUS
  Administered 2014-05-15 (×2): 1 mg via INTRAVENOUS
  Administered 2014-05-15: 2 mg via INTRAVENOUS

## 2014-05-15 MED ORDER — INSULIN GLARGINE 100 UNIT/ML ~~LOC~~ SOLN: 30.0000 [IU] | Freq: Every day | SUBCUTANEOUS | Status: AC

## 2014-05-15 MED ORDER — BENZONATATE 100 MG PO CAPS: 100.0000 mg | ORAL_CAPSULE | Freq: Three times a day (TID) | ORAL | Status: DC | PRN

## 2014-05-15 MED ORDER — ALBUTEROL SULFATE (2.5 MG/3ML) 0.083% IN NEBU: 2.5000 mg | INHALATION_SOLUTION | RESPIRATORY_TRACT | Status: AC | PRN

## 2014-05-15 MED ORDER — MEPERIDINE HCL 100 MG/ML IJ SOLN
INTRAMUSCULAR | Status: AC
  Filled 2014-05-15: qty 2

## 2014-05-15 NOTE — Progress Notes (Signed)
CRITICAL VALUE ALERT  Critical value received:  CBG 60   Date of notification:  05/15/14  Time of notification:  0800  Critical value read back:yes  Nurse who received alert:  Sammuel Bailiff, RN   MD notified (1st page):  Dr. Vanessa Barbara  Time of first page:  0801  MD notified (2nd page):  Time of second page:  Responding MD:  Dr. Vanessa Barbara  Time MD responded:  [phone]

## 2014-05-15 NOTE — Op Note (Signed)
[facility] [address] [place], [address]   ENDOSCOPY PROCEDURE REPORT  PATIENT: Kelli, Stewart  MR#: [id number] BIRTHDATE: 21-May-1946 , 67  yrs. old GENDER: Female  ENDOSCOPIST: Jonette Eva, MD REFFERED AL:PFXTKW Emelda Brothers, M.D.  PROCEDURE DATE:  05/15/2014 PROCEDURE:   EGD with biopsy and with dilatation over guidewire  INDICATIONS:1.  dyspepsia.   2.  dysphagia.   3.  diarrhea. MEDICATIONS: Demerol 100 mg IV and Versed 6 mg IV TOPICAL ANESTHETIC: Viscous Xylocaine  DESCRIPTION OF PROCEDURE:   After the risks benefits and alternatives of the procedure were thoroughly explained, informed consent was obtained.  The [id number] ([id number])  endoscope was introduced through the mouth and advanced to the second portion of the duodenum. The instrument was slowly withdrawn as the mucosa was carefully examined.  Prior to withdrawal of the scope, the guidwire was placed.  The esophagus was dilated successfully.  The patient was recovered in endoscopy and discharged home in satisfactory condition.   ESOPHAGUS: A possible stricture was found at the gastroesophageal junction.  The stenosis was traversable with the endoscope. STOMACH: Mild erosive gastritis (inflammation) was found in the gastric antrum.  Multiple biopsies were performed using cold forceps.   DUODENUM: The duodenal mucosa showed no abnormalities in the bulb and second portion of the duodenum.  Cold forcep biopsies were taken in the second portion.   Dilation was then performed at the gastroesphageal junction  Dilator: Savary over guidewire Size(s): 12.8-16 mm Resistance: minimal Heme: yes-VERY SLIGHT  COMPLICATIONS: There were no complications.  ENDOSCOPIC IMPRESSION: 1.   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   MILD Erosive gastritis  RECOMMENDATIONS: CONTINUE PROTONIX.  TAKE 30 MINUTES PRIOR TO MEALS TWICE DAILY for 3 mos then once daily. FOLLOW A LOW FAT/diabetic  diet. BIOPSY WILL BE BACK IN 7 DAYS.  FOLLOW UP IN 3 MOS.  If dysphagia persists, pt needs BPE.      _______________________________ Rosalie DoctorJonette Eva, MD 05/15/2014 3:24 PM

## 2014-05-15 NOTE — Progress Notes (Signed)
Patient for EGD. Verified with Kelli Bailiff, RN, that insulin would be held today due to NPO status. Last dose of Heparin on 6/8 at 1400. GI pathogen panel negative. Will likely be discharged after procedure. Verified with nursing staff that this would be appropriate, and pathology could be reviewed as outpatient. Kelli Stewart, ANP-BC [facility]

## 2014-05-15 NOTE — Discharge Instructions (Signed)
I dilated your esophagus DUE TO YOUR PROBLEM SWALLOWING. You have gastritis. I biopsied your stomach AND DUODENUM.   CONTINUE PROTONIX. TAKE 30 MINUTES PRIOR TO MEALS TWICE DAILY for 3 mos then once daily.  FOLLOW A LOW FAT/diabetic diet. SEE INFO BELOW on a low fat diet.  YOUR BIOPSY WILL BE BACK IN 7 DAYS.  FOLLOW UP IN 3 MOS.  UPPER ENDOSCOPY AFTER CARE Read the instructions outlined below and refer to this sheet in the next week. These discharge instructions provide you with general information on caring for yourself after you leave the hospital. While your treatment has been planned according to the most current medical practices available, unavoidable complications occasionally occur. If you have any problems or questions after discharge, call DR. Chaos Carlile, [phone].  ACTIVITY  You may resume your regular activity, but move at a slower pace for the next 24 hours.   Take frequent rest periods for the next 24 hours.   Walking will help get rid of the air and reduce the bloated feeling in your belly (abdomen).   No driving for 24 hours (because of the medicine (anesthesia) used during the test).   You may shower.   Do not sign any important legal documents or operate any machinery for 24 hours (because of the anesthesia used during the test).    NUTRITION  Drink plenty of fluids.   You may resume your normal diet as instructed by your doctor.   Begin with a light meal and progress to your normal diet. Heavy or fried foods are harder to digest and may make you feel sick to your stomach (nauseated).   Avoid alcoholic beverages for 24 hours or as instructed.    MEDICATIONS  You may resume your normal medications.   WHAT YOU CAN EXPECT TODAY  Some feelings of bloating in the abdomen.   Passage of more gas than usual.    IF YOU HAD A BIOPSY TAKEN DURING THE UPPER ENDOSCOPY:  Eat a soft diet IF YOU HAVE NAUSEA, BLOATING, ABDOMINAL PAIN, OR VOMITING.    FINDING  OUT THE RESULTS OF YOUR TEST Not all test results are available during your visit. DR. Darrick PennaFIELDS WILL CALL YOU WITHIN 7 DAYS OF YOUR PROCEDUE WITH YOUR RESULTS. Do not assume everything is normal if you have not heard from DR. Dvontae Ruan IN ONE WEEK, CALL HER OFFICE AT [phone].  SEEK IMMEDIATE MEDICAL ATTENTION AND CALL THE OFFICE: [phone] IF:  You have more than a spotting of blood in your stool.   Your belly is swollen (abdominal distention).   You are nauseated or vomiting.   You have a temperature over 101F.   You have abdominal pain or discomfort that is severe or gets worse throughout the day.  Gastritis  Gastritis is an inflammation (the body's way of reacting to injury and/or infection) of the stomach. It is often caused by viral or bacterial (germ) infections. It can also be caused BY ASPIRIN, BC/GOODY POWDER'S, (IBUPROFEN) MOTRIN, OR ALEVE (NAPROXEN), chemicals (including alcohol), SPICY FOODS, and medications. This illness may be associated with generalized malaise (feeling tired, not well), UPPER ABDOMINAL STOMACH cramps, and fever. One common bacterial cause of gastritis is an organism known as H. Pylori. This can be treated with antibiotics.   PROBABLE ESOPHAGEAL STRICTURE  Esophageal strictures can be caused by stomach acid backing up into the tube that carries food from the mouth down to the stomach (lower esophagus).  TREATMENT There are a number of medicines used to treat  reflux/stricture, including: Antacids.  Proton-pump inhibitors: PROTONIX  HOME CARE INSTRUCTIONS Eat 2-3 hours before going to bed.  Try to reach and maintain a healthy weight.  Do not eat just a few very large meals. Instead, eat 4 TO 6 smaller meals throughout the day.  Try to identify foods and beverages that make your symptoms worse, and avoid these.  Avoid tight clothing.  Do not exercise right after eating.  Low-Fat Diet BREADS, CEREALS, PASTA, RICE, DRIED PEAS, AND BEANS These  products are high in carbohydrates and most are low in fat. Therefore, they can be increased in the diet as substitutes for fatty foods. They too, however, contain calories and should not be eaten in excess. Cereals can be eaten for snacks as well as for breakfast.   FRUITS AND VEGETABLES It is good to eat fruits and vegetables. Besides being sources of fiber, both are rich in vitamins and some minerals. They help you get the daily allowances of these nutrients. Fruits and vegetables can be used for snacks and desserts.  MEATS Limit lean meat, chicken, [place], and fish to no more than 6 ounces per day. Beef, Pork, and Lamb Use lean cuts of beef, pork, and lamb. Lean cuts include:  Extra-lean ground beef.  Arm roast.  Sirloin tip.  Center-cut ham.  Round steak.  Loin chops.  Rump roast.  Tenderloin.  Trim all fat off the outside of meats before cooking. It is not necessary to severely decrease the intake of red meat, but lean choices should be made. Lean meat is rich in protein and contains a highly absorbable form of iron. Premenopausal women, in particular, should avoid reducing lean red meat because this could increase the risk for low red blood cells (iron-deficiency anemia).  Chicken and [place] These are good sources of protein. The fat of poultry can be reduced by removing the skin and underlying fat layers before cooking. Chicken and [place] can be substituted for lean red meat in the diet. Poultry should not be fried or covered with high-fat sauces. Fish and Shellfish Fish is a good source of protein. Shellfish contain cholesterol, but they usually are low in saturated fatty acids. The preparation of fish is important. Like chicken and [place], they should not be fried or covered with high-fat sauces. EGGS Egg whites contain no fat or cholesterol. They can be eaten often. Try 1 to 2 egg whites instead of whole eggs in recipes or use egg substitutes that do not contain yolk. MILK AND  DAIRY PRODUCTS Use skim or 1% milk instead of 2% or whole milk. Decrease whole milk, natural, and processed cheeses. Use nonfat or low-fat (2%) cottage cheese or low-fat cheeses made from vegetable oils. Choose nonfat or low-fat (1 to 2%) yogurt. Experiment with evaporated skim milk in recipes that call for heavy cream. Substitute low-fat yogurt or low-fat cottage cheese for sour cream in dips and salad dressings. Have at least 2 servings of low-fat dairy products, such as 2 glasses of skim (or 1%) milk each day to help get your daily calcium intake. FATS AND OILS Reduce the total intake of fats, especially saturated fat. Butterfat, lard, and beef fats are high in saturated fat and cholesterol. These should be avoided as much as possible. Vegetable fats do not contain cholesterol, but certain vegetable fats, such as coconut oil, palm oil, and palm kernel oil are very high in saturated fats. These should be limited. These fats are often used in [employer], processed foods, popcorn, oils,  and nondairy creamers. Vegetable shortenings and some peanut butters contain hydrogenated oils, which are also saturated fats. Read the labels on these foods and check for saturated vegetable oils. Unsaturated vegetable oils and fats do not raise blood cholesterol. However, they should be limited because they are fats and are high in calories. Total fat should still be limited to 30% of your daily caloric intake. Desirable liquid vegetable oils are corn oil, cottonseed oil, olive oil, canola oil, safflower oil, soybean oil, and sunflower oil. Peanut oil is not as good, but small amounts are acceptable. Buy a heart-healthy tub margarine that has no partially hydrogenated oils in the ingredients. Mayonnaise and salad dressings often are made from unsaturated fats, but they should also be limited because of their high calorie and fat content. Seeds, nuts, peanut butter, olives, and avocados are high in fat, but the fat is mainly  the unsaturated type. These foods should be limited mainly to avoid excess calories and fat. OTHER EATING TIPS Snacks  Most sweets should be limited as snacks. They tend to be rich in calories and fats, and their caloric content outweighs their nutritional value. Some good choices in snacks are graham crackers, melba toast, soda crackers, bagels (no egg), English muffins, fruits, and vegetables. These snacks are preferable to snack crackers, [place] fries, TORTILLA CHIPS, and POTATO chips. Popcorn should be air-popped or cooked in small amounts of liquid vegetable oil. Desserts Eat fruit, low-fat yogurt, and fruit ices instead of pastries, cake, and cookies. Sherbet, angel food cake, gelatin dessert, frozen low-fat yogurt, or other frozen products that do not contain saturated fat (pure fruit juice bars, frozen ice pops) are also acceptable.  COOKING METHODS Choose those methods that use little or no fat. They include: Poaching.  Braising.  Steaming.  Grilling.  Baking.  Stir-frying.  Broiling.  Microwaving.  Foods can be cooked in a nonstick pan without added fat, or use a nonfat cooking spray in regular cookware. Limit fried foods and avoid frying in saturated fat. Add moisture to lean meats by using water, broth, cooking wines, and other nonfat or low-fat sauces along with the cooking methods mentioned above. Soups and stews should be chilled after cooking. The fat that forms on top after a few hours in the refrigerator should be skimmed off. When preparing meals, avoid using excess salt. Salt can contribute to raising blood pressure in some people.  EATING AWAY FROM HOME Order entres, potatoes, and vegetables without sauces or butter. When meat exceeds the size of a deck of cards (3 to 4 ounces), the rest can be taken home for another meal. Choose vegetable or fruit salads and ask for low-calorie salad dressings to be served on the side. Use dressings sparingly. Limit high-fat toppings, such  as bacon, crumbled eggs, cheese, sunflower seeds, and olives. Ask for heart-healthy tub margarine instead of butter.

## 2014-05-15 NOTE — Interval H&P Note (Signed)
History and Physical Interval Note:  05/15/2014 2:27 PM  Kelli Stewart  has presented today for surgery, with the diagnosis of Dysphagia  The various methods of treatment have been discussed with the patient and family. After consideration of risks, benefits and other options for treatment, the patient has consented to  Procedure(s) with comments: ESOPHAGOGASTRODUODENOSCOPY (EGD) (N/A) - WITH DILATION  SAVORY DILATION (N/A) MALONEY DILATION (N/A) as a surgical intervention .  The patient's history has been reviewed, patient examined, no change in status, stable for surgery.  I have reviewed the patient's chart and labs.  Questions were answered to the patient's satisfaction.     Kelli Stewart
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
## 2014-05-15 NOTE — Progress Notes (Signed)
  Patient Details  Name: Kelli Stewart MRN: [id number] Date of Birth: [birth year]  Today's Date: 05/15/2014 Time:  -  12:45-12:55    Developed HEP for patient to continue for strengthening.  Gave patient HEP and reviewed exercises to continue at home.      [name] 05/15/2014, 2:25 PM

## 2014-05-15 NOTE — Progress Notes (Signed)
Pt's CBG was low at 49. Pt received some OJ and CBG increased to 89 in 15 minutes. Will continue to monitor.

## 2014-05-15 NOTE — Progress Notes (Signed)
Inpatient Diabetes Program Recommendations  AACE/ADA: New Consensus Statement on Inpatient Glycemic Control (2013)  Target Ranges:  Prepandial:   less than 140 mg/dL      Peak postprandial:   less than 180 mg/dL (1-2 hours)      Critically ill patients:  140 - 180 mg/dL   Results for ZAHLI, VETSCH (MRN [id number]) as of 05/15/2014 07:49  Ref. Range 05/14/2014 07:48 05/14/2014 12:34 05/14/2014 15:52 05/14/2014 21:37 05/15/2014 06:55  Glucose-Capillary Latest Range: 70-99 mg/dL 120 (H) 103 (H) 155 (H) 315 (H) 70    Diabetes history: DM2 Outpatient Diabetes medications: Lantus 40 daily, Novolog 5-10 TID, Amaryl 2 QAM Current orders for Inpatient glycemic control: Lantus 30 units daily, Novolog 0-20 units AC, Novolog 0-5 units HS, Novolog 12 units TID with meals for meal coverage  Inpatient Diabetes Program Recommendations Insulin - Meal Coverage: Noted patient did not receive meal coverage for supper on 6/8. As a result post prandial glucose up to 315 mg/dl at 21:37 last night. Currently patient is NPO for EGD and diliation today so meal coverage should not be given until diet is resumed. NURSING: Please be sure meal coverage is given once diet is resumed if ordered parameters are met.  Thanks, Barnie Alderman, RN, MSN, CCRN Diabetes Coordinator Inpatient Diabetes Program [phone] (Team Pager) [phone] (AP office) [phone] [facility] office)

## 2014-05-15 NOTE — Telephone Encounter (Signed)
Pt is aware of OV on 7/21 at 10 with AS and appt card mailed

## 2014-05-15 NOTE — Care Management Note (Signed)
    Page 1 of 1   05/15/2014     2:40:55 PM CARE MANAGEMENT NOTE 05/15/2014  Patient:  APPOLLONIA, SMOLEY   Account Number:  [id number]  Date Initiated:  05/15/2014  Documentation initiated by:  Rosemary Holms  Subjective/Objective Assessment:   Pt [place] at [place]. She does not have a PCP and states she will set one up after DC. CM offer to assist.     Action/Plan:   Ordered HH PT. Without PCP, can not have HH. 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 She states that she has requested this and PT will bring to her.   Anticipated DC Date:  05/15/2014   Anticipated DC Plan:  HOME/SELF CARE      DC Planning Services  CM consult      Choice offered to / List presented to:             Status of service:  Completed, signed off Medicare Important Message given?  YES (If response is "NO", the following Medicare IM given date fields will be blank) Date Medicare IM given:  05/15/2014 Date Additional Medicare IM given:    Discharge Disposition:    Per UR Regulation:    If discussed at Long Length of Stay Meetings, dates discussed:    Comments:  05/15/14 Rosemary Holms RN BSN CM

## 2014-05-15 NOTE — Telephone Encounter (Signed)
Please have patient return in 4-6 weeks for hospital follow-up. Thanks!

## 2014-05-15 NOTE — Evaluation (Signed)
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 She denies any hematemesis or rectal bleeding. There is abdominal discomfort with the diarrhea. She has not had a fever. She had been to the emergency room in [place] and was given potassium and magnesium and not admitted at that time. She has not improved. She is diabetic, has polymyalgia rheumatica. Apparently about a month ago several medications were reduced for laminated by her primary care provider. The patient herself relates this to the beginning of her symptoms. She is not in contact with any other person who has had similar symptoms. She does not remember eating anything out of the ordinary. She now is being admitted for further investigation and management. In the emergency room, she was found to be hypokalemic.  Clinical Impression  Patient presents to PT after MD referral for assessment of mobility skills.  Patient lives alone in a single story home with a ramp entrance; patient does report she has neighbors nearby who are able to help some.  Prior to hospitalization, the patient was (I) with bed mobility skills (with use of momentum to transfer) and mod (I) for transfers and amb with use of standard cane.  During evaluation, the patient was (I) with bed mobility skills, mod (I) for transfers, and use of standard cane and min guard for ambulation in the room.  Patient does report a significant history of falls, ~2 a month due to low lab values.  Recommend continued PT while patient is in the hospital, and transition to HHPT to continue strengthening, balance training, and improvement of functional mobility skills.  Recommend use of RW for ambulation skills as patient has a significant history of falls, and balance deficits.      Follow Up  Recommendations Home health PT    Equipment Recommendations  Rolling walker with 5" wheels    Recommendations for Other Services       Precautions / Restrictions Precautions Precautions: Fall Precaution Comments: Contact Precautions Restrictions Weight Bearing Restrictions: No      Mobility  Bed Mobility Overal bed mobility: Independent                Transfers Overall transfer level: Modified independent Equipment used: Straight cane                Ambulation/Gait Overall Transfer level: Min guard (secondary to history of multiple falls) Ambulation Distance (Feet): 25 Feet Assistive device: Straight cane Gait Pattern/deviations: Decreased dorsiflexion - right;Decreased dorsiflexion - left   Gait velocity interpretation: at or above normal speed for age/gender           Balance Overall balance assessment: History of Falls;Needs assistance (Patient reports ~2 falls a month, when labs are off) Sitting-balance support: Feet supported Sitting balance-Leahy Scale: Good     Standing balance support: Single extremity supported                                 Pertinent Vitals/Pain Patient reports chronic pain in low back, with score of 6/10 today.  Patient was given rx by RN (when pain was 10/10), and patient reports pain has been decreasing.     Home Living Family/patient expects to be discharged to:: Private residence Living Arrangements: Alone Available Help at Discharge: Neighbor  Type of Home: House Home Access: Ramped entrance Entrance Stairs-Rails: Can reach both   Home Layout: One level Home Equipment: Cane - quad;Cane - single point      Prior Function Level of Independence: Independent with assistive device(s)         Comments: Ambulates with use of standard cane, and just recieved a quad cane           Communication   Communication: No difficulties  Cognition Arousal/Alertness: Awake/alert Behavior During  Therapy: WFL for tasks assessed/performed                                 Assessment/Plan    PT Assessment Patient needs continued PT services  PT Diagnosis Difficulty walking;Generalized weakness   PT Problem List Decreased strength;Decreased knowledge of use of DME;Decreased activity tolerance;Decreased mobility;Pain  PT Treatment Interventions DME instruction;Balance training;Gait training;Neuromuscular re-education;Functional mobility training;Therapeutic activities;Therapeutic exercise   PT Goals (Current goals can be found in the Care Plan section) Acute Rehab PT Goals PT Goal Formulation: With patient Time For Goal Achievement: 05/29/14    Frequency Min 3X/week    End of Session Equipment Utilized During Treatment: Gait belt Activity Tolerance: Patient tolerated treatment well Patient left: in bed           Time: 1027-1042 PT Time Calculation (min): 15 min   Charges:   PT Evaluation $Initial PT Evaluation Tier I: 1 Procedure      [name] 05/15/2014, 10:49 AM

## 2014-05-15 NOTE — Discharge Summary (Signed)
Physician Discharge Summary  Kelli Stewart ZOX:[id number] DOB: [birth year] DOA: 05/11/2014  PCP: Kelli Doe, MD  Admit date: 05/11/2014 Discharge date: 05/15/2014  Time spent: 35 minutes  Recommendations for Outpatient Follow-up:  1. Please follow up on her steroid taper, she presented with nausea/vomiting after a decrease in her prednisone dose from 30 mg to 5 mg in the outpatient setting. Symptoms likely related to adrenal insufficiency as she improved with steroid therapy. She had been on steroids for PMR. She was given a script for Prednisone 40 mg PO q daily for 5 days following by 30 mg PO q daily for 5 days then continued at 20 mg PO q daily until seen by her PCP  Discharge Diagnoses:  Active Problems:   DM   HYPERTENSION   Vomiting and diarrhea   Hypokalemia   PMR (polymyalgia rheumatica)   Dysphagia   Discharge Condition: Stable  Diet recommendation: Carb Modified Diet  Filed Weights   05/13/14 1500 05/14/14 0500 05/15/14 0430  Weight: 90.765 kg (200 lb 1.6 oz) 90.266 kg (199 lb) 90.901 kg (200 lb 6.4 oz)    History of present illness:  Kelli Stewart is a 68 y.o. 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 She denies any hematemesis or rectal bleeding. There is abdominal discomfort with the diarrhea. She has not had a fever. She had been to the emergency room in [place] and was given potassium and magnesium and not admitted at that time. She has not improved. She is diabetic, has polymyalgia rheumatica. Apparently about a month ago several medications were reduced for laminated by her primary care provider. The patient herself relates this to the beginning of her symptoms. She is not in contact with any other person who has had similar symptoms. She does not remember eating anything out of the ordinary. She now is being admitted for further investigation and management. In the emergency room, she was found to  be hypokalemic.   Hospital Course:  Pressure is is a 60 of the lower history: 100, service on 05/12/1999 general she presented with also episodes of nausea vomiting or diarrhea that started approximately weeks ago. She is treated that's about a month ago present dose was decreased from 30 mg by mouth daily 5 mg by mouth daily shortly afterwards her symptoms started. She denies report rectal, hematemesis. She had a CT scan of the abdomen and pelvis which did not show chief margins. On admission she was given 125 mg of Solu-Medrol then continued with prednisone 60 mg by mouth daily. With the rapid decrease in steroids approximately 1 month ago that corresponded to the onset of symptoms, it was felt this likely represented adrenal insufficiency. This was supported by the fact that her symptoms improved after increasing steroid dose during this hospitalization. Other issues addressed during this hospitalization included dysphagia to solids as GI was consulted for EGD. Patient undergoing EGD on 05/15/2014. She was also found to have an acute asthmatic exacerbation treated with steroids and Nebs.   Procedures:  EGD performed on 05/15/2014  Consultations:  GI  Discharge Exam: Filed Vitals:   05/15/14 0430  BP: 98/84  Pulse: 105  Temp: 97.5 F (36.4 C)  Resp: 16   General: No acute distress, she is awake and alert  Cardiovascular: Regular rate and rhythm, normal S1S2  Respiratory: Improved breath sounds, having a few expiratory wheezes Abdomen: Soft nontender nondistended  Musculoskeletal: No edema    Discharge  Instructions You were cared for by a hospitalist during your hospital stay. If you have any questions about your discharge medications or the care you received while you were in the hospital after you are discharged, you can call the unit and asked to speak with the hospitalist on call if the hospitalist that took care of you is not available. Once you are discharged, your primary care  physician will handle any further medical issues. Please note that NO REFILLS for any discharge medications will be authorized once you are discharged, as it is imperative that you return to your primary care physician (or establish a relationship with a primary care physician if you do not have one) for your aftercare needs so that they can reassess your need for medications and monitor your lab values.  Discharge Instructions   Call MD for:  difficulty breathing, headache or visual disturbances    Complete by:  As directed      Call MD for:  extreme fatigue    Complete by:  As directed      Call MD for:  persistant dizziness or light-headedness    Complete by:  As directed      Call MD for:  persistant nausea and vomiting    Complete by:  As directed      Call MD for:  redness, tenderness, or signs of infection (pain, swelling, redness, odor or green/yellow discharge around incision site)    Complete by:  As directed      Call MD for:  severe uncontrolled pain    Complete by:  As directed      Call MD for:  temperature >100.4    Complete by:  As directed      Diet - low sodium heart healthy    Complete by:  As directed      Discharge instructions    Complete by:  As directed   Please follow up with your Family Physician in 1 week to plan a slow steroid taper.     Increase activity slowly    Complete by:  As directed             Medication List    STOP taking these medications       loratadine 10 MG tablet  Commonly known as:  CLARITIN     metFORMIN 1000 MG tablet  Commonly known as:  GLUCOPHAGE     promethazine 25 MG tablet  Commonly known as:  PHENERGAN      TAKE these medications       albuterol 108 (90 BASE) MCG/ACT inhaler  Commonly known as:  PROVENTIL HFA;VENTOLIN HFA  Inhale 2 puffs into the lungs every 6 (six) hours as needed. For shortness of breath/wheezing     albuterol (2.5 MG/3ML) 0.083% nebulizer solution  Commonly known as:  PROVENTIL  Inhale 3 mLs  (2.5 mg total) into the lungs every 4 (four) hours as needed for wheezing or shortness of breath.     aspirin EC 81 MG tablet  Take 81 mg by mouth at bedtime.     benzonatate 100 MG capsule  Commonly known as:  TESSALON  Take 1 capsule (100 mg total) by mouth 3 (three) times daily as needed for cough.     CALCIUM PLUS VITAMIN D PO  Take 1 tablet by mouth daily.     chlorthalidone 25 MG tablet  Commonly known as:  HYGROTON  Take 25 mg by mouth daily.     diphenoxylate-atropine 2.5-0.025  MG per tablet  Commonly known as:  LOMOTIL  Take 1-2 tablets by mouth daily as needed for diarrhea or loose stools.     Fluticasone-Salmeterol 100-50 MCG/DOSE Aepb  Commonly known as:  ADVAIR  Inhale 1 puff into the lungs every 12 (twelve) hours.     glimepiride 2 MG tablet  Commonly known as:  AMARYL  Take 2 mg by mouth daily before breakfast.     HYDROcodone-acetaminophen 10-325 MG per tablet  Commonly known as:  NORCO  Take 1 tablet by mouth 3 (three) times daily as needed. pain     insulin glargine 100 UNIT/ML injection  Commonly known as:  LANTUS  Inject 0.3 mLs (30 Units total) into the skin daily.     insulin regular 100 units/mL injection  Commonly known as:  NOVOLIN R,HUMULIN R  Inject 5-10 Units into the skin 3 (three) times daily before meals. Based on sliding scale     isosorbide mononitrate 60 MG 24 hr tablet  Commonly known as:  IMDUR  Take 60 mg by mouth every morning.     lamoTRIgine 25 MG tablet  Commonly known as:  LAMICTAL  Take 25 mg by mouth 2 (two) times daily.     latanoprost 0.005 % ophthalmic solution  Commonly known as:  XALATAN  Place 1 drop into both eyes at bedtime.     Magnesium 250 MG Tabs  Take 1 tablet by mouth 2 (two) times daily.     meclizine 12.5 MG tablet  Commonly known as:  ANTIVERT  Take 12.5 mg by mouth 3 (three) times daily as needed for dizziness.     methocarbamol 750 MG tablet  Commonly known as:  ROBAXIN  Take 1 tablet (750 mg  total) by mouth every 6 (six) hours as needed for muscle spasms.     montelukast 10 MG tablet  Commonly known as:  SINGULAIR  Take 1 tablet (10 mg total) by mouth at bedtime.     multivitamins ther. w/minerals Tabs tablet  Take 1 tablet by mouth daily.     nitroGLYCERIN 0.4 MG SL tablet  Commonly known as:  NITROSTAT  Place 0.4 mg under the tongue every 5 (five) minutes x 3 doses as needed. For chest pain. Do not exceed 3 tablets in 15 minutes     nystatin 100000 UNIT/ML suspension  Commonly known as:  MYCOSTATIN  Take 5 mLs by mouth 2 (two) times daily.     omeprazole 40 MG capsule  Commonly known as:  PRILOSEC  Take 40 mg by mouth daily.     potassium chloride 10 MEQ tablet  Commonly known as:  K-DUR  Take 10 mEq by mouth daily.     predniSONE 50 MG tablet  Commonly known as:  DELTASONE  Take 40mg  PO q daily for 5 days, followed by 30 mg PO q daily for 5 days, then continue at 20 mg PO q daily     ranitidine 300 MG tablet  Commonly known as:  ZANTAC  Take 1 tablet by mouth daily.     simvastatin 20 MG tablet  Commonly known as:  ZOCOR  Take 20 mg by mouth daily.     verapamil 240 MG 24 hr capsule  Commonly known as:  VERELAN PM  Take 240 mg by mouth at bedtime.     Vitamin D-3 1000 UNITS Caps  Take 1 capsule by mouth daily.       Allergies  Allergen Reactions  . Food Anaphylaxis  WALNUTS  . Iodine Hives, Shortness Of Breath and Swelling  . Motrin [Ibuprofen] Hives, Shortness Of Breath and Swelling  . Ambien [Zolpidem Tartrate] Other (See Comments)    Sleepwalking, hallucinations  . Betadine [Povidone Iodine] Other (See Comments)    Blisters when placed on open skin  . Carisoprodol Other (See Comments)    Decreased pulse, syncope  . Celebrex [Celecoxib] Other (See Comments)    Chest pain  . Enalapril Hypertension  . Hctz [Hydrochlorothiazide] Other (See Comments)    Kidney pain  . Lasix [Furosemide] Other (See Comments)    Kidney pain  . Maxidex  [Dexamethasone] Swelling and Other (See Comments)    Kidney pain  . Maxzide [Hydrochlorothiazide W-Triamterene] Other (See Comments)    Kidney pain  . Theophyllines Other (See Comments) and Hypertension    Visual problems  . Lopressor [Metoprolol] Rash    Feet swelling        Follow-up Information   Follow up with Kelli Doe, MD In 2 weeks.   Specialty:  Internal Medicine       The results of significant diagnostics from this hospitalization (including imaging, microbiology, ancillary and laboratory) are listed below for reference.    Significant Diagnostic Studies: Dg Chest 2 View  05/11/2014   CLINICAL DATA:  Cough and wheezing for 3 weeks.  EXAM: CHEST  2 VIEW  COMPARISON:  PA and lateral chest 10/30/2013.  FINDINGS: Lungs are clear. Heart size is normal. There is no pneumothorax or pleural effusion.  IMPRESSION: No acute disease.   Electronically Signed   By: Drusilla Kanner M.D.   On: 05/11/2014 15:12   Ct Angio Chest W/cm &/or Wo Cm  05/11/2014   CLINICAL DATA:  Shortness of breath and leg pain. Evaluate for pulmonary embolus.  EXAM: CT ANGIOGRAPHY CHEST WITH CONTRAST  TECHNIQUE: Multidetector CT imaging of the chest was performed using the standard protocol during bolus administration of intravenous contrast. Multiplanar CT image reconstructions and MIPs were obtained to evaluate the vascular anatomy.  CONTRAST:  OMNIPAQUE IOHEXOL 350 MG/ML SOLN  COMPARISON:  None.  FINDINGS: No pulmonary embolus. No pathologically enlarged mediastinal, hilar or axillary lymph nodes. Atherosclerotic calcification of the arterial vasculature, including coronary arteries. Heart is at the upper limits of normal in size to mildly enlarged. No pericardial effusion.  Lungs are clear.  No pleural fluid.  Airway is unremarkable.  Incidental imaging of the upper abdomen shows marked low-attenuation with throughout the visualized portion of the liver. Visualized portions of the right adrenal gland,  spleen and stomach are grossly unremarkable. No worrisome lytic or sclerotic lesions. Degenerative changes are seen in the spine.  Review of the MIP images confirms the above findings.  IMPRESSION: 1. Negative for pulmonary embolus. 2. Coronary artery calcification. 3. Hepatic steatosis.   Electronically Signed   By: Leanna Battles M.D.   On: 05/11/2014 17:32   Ct Abdomen Pelvis W Contrast  05/12/2014   CLINICAL DATA:  Nausea, vomiting and diarrhea for the past 2 days. Abdominal pain all over.  EXAM: CT ABDOMEN AND PELVIS WITH CONTRAST  TECHNIQUE: Multidetector CT imaging of the abdomen and pelvis was performed using the standard protocol following bolus administration of intravenous contrast.  CONTRAST:  50mL OMNIPAQUE IOHEXOL 300 MG/ML SOLN, OMNIPAQUE IOHEXOL 300 MG/ML SOLN  COMPARISON:  No priors.  FINDINGS: Lung Bases: Unremarkable.  Abdomen/Pelvis: Diffuse low attenuation throughout the hepatic parenchyma, compatible with hepatic steatosis. No focal cystic or solid hepatic lesions. Status post cholecystectomy. The appearance of  the pancreas, spleen, bilateral adrenal glands and bilateral kidneys is unremarkable. No significant volume of ascites. No pneumoperitoneum. No pathologic distention of small bowel. No lymphadenopathy identified within the abdomen or pelvis. Atherosclerosis throughout the abdominal and pelvic vasculature, without evidence of aneurysm or dissection. Status post hysterectomy. Ovaries are atrophic. Urinary bladder is normal in appearance.  Musculoskeletal: There are no aggressive appearing lytic or blastic lesions noted in the visualized portions of the skeleton.  IMPRESSION: 1. No acute findings in the abdomen or pelvis to account for the patient's symptoms. 2. Hepatic steatosis. 3. Status post cholecystectomy and hysterectomy. 4. Atherosclerosis.   Electronically Signed   By: Trudie Reedaniel  Entrikin M.D.   On: 05/12/2014 17:28   [place] Venous Img Lower Unilateral Left  05/11/2014    CLINICAL DATA:  Left leg swelling, reported history of prior DVT  EXAM: LEFT LOWER EXTREMITY VENOUS DOPPLER ULTRASOUND  TECHNIQUE: Gray-scale sonography with graded compression, as well as color Doppler and duplex ultrasound were performed to evaluate the lower extremity deep venous systems from the level of the common femoral vein and including the common femoral, femoral, profunda femoral, popliteal and calf veins including the posterior tibial, peroneal and gastrocnemius veins when visible. The superficial great saphenous vein was also interrogated. Spectral Doppler was utilized to evaluate flow at rest and with distal augmentation maneuvers in the common femoral, femoral and popliteal veins.  COMPARISON:  None.  FINDINGS: Common Femoral Vein: No evidence of thrombus. Normal compressibility, respiratory phasicity and response to augmentation.  Saphenofemoral Junction: No evidence of thrombus. Normal compressibility and flow on color Doppler imaging.  Profunda Femoral Vein: No evidence of thrombus. Normal compressibility and flow on color Doppler imaging.  Femoral Vein: No evidence of thrombus. Normal compressibility, respiratory phasicity and response to augmentation.  Popliteal Vein: No evidence of thrombus. Normal compressibility, respiratory phasicity and response to augmentation.  Calf Veins: No evidence of thrombus. Normal compressibility and flow on color Doppler imaging.  Superficial Great Saphenous Vein: No evidence of thrombus. Normal compressibility and flow on color Doppler imaging.  Venous Reflux:  None.  Other Findings:  None.  IMPRESSION: No evidence of deep venous thrombosis.   Electronically Signed   By: Salome HolmesHector  Cooper M.D.   On: 05/11/2014 16:54    Microbiology: Recent Results (from the past 240 hour(s))  STOOL CULTURE     Status: None   Collection Time    05/12/14  4:25 AM      Result Value Ref Range Status   Specimen Description PERIRECTAL   Final   Special Requests NONE   Final    Culture     Final   Value: NO SUSPICIOUS COLONIES, CONTINUING TO HOLD     Performed at [facility]   Report Status PENDING   Incomplete  CLOSTRIDIUM DIFFICILE BY PCR     Status: None   Collection Time    05/12/14 11:08 AM      Result Value Ref Range Status   C difficile by pcr NEGATIVE  NEGATIVE Final     Labs: Basic Metabolic Panel:  Recent Labs Lab 05/11/14 1320 05/12/14 0549 05/13/14 0601 05/14/14 0727 05/15/14 0545  NA 141 138 146 147 149*  K 2.9* 3.5* 4.5 4.1 4.1  CL 95* 100 109 110 110  CO2 30 24 23 25 27   GLUCOSE 124* 385* 136* 131* 92  BUN 13 12 8 9 12   CREATININE 0.82 0.72 0.67 0.64 0.68  CALCIUM 9.0 7.6* 7.8* 7.5* 7.9*   Liver Function  Tests:  Recent Labs Lab 05/11/14 1320 05/12/14 0549  AST 37 27  ALT 26 22  ALKPHOS 65 50  BILITOT 0.9 0.5  PROT 6.9 5.6*  ALBUMIN 3.1* 2.5*   No results found for this basename: LIPASE, AMYLASE,  in the last 168 hours No results found for this basename: AMMONIA,  in the last 168 hours CBC:  Recent Labs Lab 05/11/14 1320 05/12/14 0549 05/13/14 0601 05/14/14 0727 05/15/14 0545  WBC 9.2 6.9 13.9* 10.5 10.4  NEUTROABS 6.5  --   --   --   --   HGB 14.1 11.8* 12.3 11.0* 11.5*  HCT 41.8 34.9* 36.2 33.8* 35.3*  MCV 95.0 94.1 96.0 98.0 99.2  PLT 276 202 249 202 203   Cardiac Enzymes: No results found for this basename: CKTOTAL, CKMB, CKMBINDEX, TROPONINI,  in the last 168 hours BNP: BNP (last 3 results)  Recent Labs  10/30/13 1236  PROBNP 75.7   CBG:  Recent Labs Lab 05/14/14 1552 05/14/14 2137 05/15/14 0655 05/15/14 0749 05/15/14 0821  GLUCAP 155* 315* 70 60* 76       Signed:  Martese Vanatta  [facility] 05/15/2014, 8:57 AM

## 2014-05-16 LAB — STOOL CULTURE

## 2014-05-17 ENCOUNTER — Encounter (HOSPITAL_COMMUNITY): Payer: Self-pay | Admitting: Gastroenterology

## 2014-05-24 ENCOUNTER — Telehealth: Payer: Self-pay | Admitting: Gastroenterology

## 2014-05-24 NOTE — Telephone Encounter (Signed)
Please call pt. HER stomach Bx shows gastritis.   CONTINUE PROTONIX. TAKE 30 MINUTES PRIOR TO MEALS TWICE DAILY for 3 mos then once daily.  FOLLOW A LOW FAT/diabetic diet.   FOLLOW UP IN 3 MOS E30 DYSPHAGIA/DIARRHEA.

## 2014-05-29 NOTE — Telephone Encounter (Signed)
Called and informed pt.  

## 2014-05-30 NOTE — Telephone Encounter (Signed)
Pt has OV with [facility] on 7-21 at 10 and [facility]

## 2014-06-26 ENCOUNTER — Ambulatory Visit: Payer: Medicare Other | Admitting: Gastroenterology

## 2014-06-26 ENCOUNTER — Ambulatory Visit (INDEPENDENT_AMBULATORY_CARE_PROVIDER_SITE_OTHER): Payer: Medicare Other | Admitting: Gastroenterology

## 2014-06-26 ENCOUNTER — Encounter: Payer: Self-pay | Admitting: Gastroenterology

## 2014-06-26 ENCOUNTER — Encounter (INDEPENDENT_AMBULATORY_CARE_PROVIDER_SITE_OTHER): Payer: Self-pay

## 2014-06-26 VITALS — BP 136/86 | HR 100 | Temp 97.4°F | Resp 18 | Ht 67.0 in | Wt 198.0 lb

## 2014-06-26 DIAGNOSIS — K2990 Gastroduodenitis, unspecified, without bleeding: Principal | ICD-10-CM

## 2014-06-26 DIAGNOSIS — B370 Candidal stomatitis: Secondary | ICD-10-CM

## 2014-06-26 DIAGNOSIS — R1310 Dysphagia, unspecified: Secondary | ICD-10-CM

## 2014-06-26 DIAGNOSIS — B373 Candidiasis of vulva and vagina: Secondary | ICD-10-CM

## 2014-06-26 DIAGNOSIS — B3731 Acute candidiasis of vulva and vagina: Secondary | ICD-10-CM

## 2014-06-26 DIAGNOSIS — R1110 Vomiting, unspecified: Secondary | ICD-10-CM

## 2014-06-26 DIAGNOSIS — R197 Diarrhea, unspecified: Secondary | ICD-10-CM

## 2014-06-26 DIAGNOSIS — K2970 Gastritis, unspecified, without bleeding: Secondary | ICD-10-CM | POA: Insufficient documentation

## 2014-06-26 MED ORDER — KETOCONAZOLE 2 % EX CREA: 1.0000 "application " | TOPICAL_CREAM | Freq: Every day | CUTANEOUS | Status: DC

## 2014-06-26 MED ORDER — KETOCONAZOLE 2 % EX CREA: 1.0000 "application " | TOPICAL_CREAM | Freq: Every day | CUTANEOUS | Status: AC

## 2014-06-26 MED ORDER — PANTOPRAZOLE SODIUM 40 MG PO TBEC: 40.0000 mg | DELAYED_RELEASE_TABLET | Freq: Two times a day (BID) | ORAL | Status: AC

## 2014-06-26 NOTE — Assessment & Plan Note (Signed)
Diflucan provided by PCP. Topical regimen for more immediate relief per patient request. Ketoconazole cream BID prn.

## 2014-06-26 NOTE — Assessment & Plan Note (Signed)
Vomiting resolved. Bowel function improved. Obtain copy of last TCS from Dr. Allena KatzPatel, patient would like to have future colonoscopies here. OV in 08/2014 with Dr. Darrick PennaFields for f/u.

## 2014-06-26 NOTE — Progress Notes (Signed)
Primary Care Physician: Phylliss Bob, MD  Primary Gastroenterologist:  Jonette Eva, MD   Chief Complaint  Patient presents with  . Follow-up    HPI: Kelli Stewart is a 68 y.o. female here for followup of recent hospitalization. She presented with asthma exacerbation and we will consult and for vomiting and diarrhea. This occurred in the setting of rapid prednisone taper and was felt she may have had some adrenal insufficiency. She underwent EGD with dilation for probable esophageal stricture. She was found to have gastritis. Stool studies were negative. She reported last colonoscopy about 3 years ago, Dr. Allena Katz. Symptoms improved during hospitalization she was discharged.  Continues to do well. Denies any further vomiting. She has 2-3 soft stools every day but no nocturnal symptoms. Denies blood in the stool or melena. Appetite improved. Denies heartburn or dysphagia. Concerned about episodes of shakes when she holds a book and her knees giving away. Denies any lightheadedness, dizziness etc. Recently developed yeast infection in the vaginal area and mouth. Started on Diflucan times one, repeat in 3 days by her PCP. Some improvement with oral symptoms but dreads voiding due to vaginal irritation.  Worried about lower extremity edema bilaterally. Has an appointment with her PCP on Friday. Had blood work yesterday.  Current Outpatient Prescriptions  Medication Sig Dispense Refill  . albuterol (PROVENTIL HFA;VENTOLIN HFA) 108 (90 BASE) MCG/ACT inhaler Inhale 2 puffs into the lungs every 6 (six) hours as needed. For shortness of breath/wheezing      . albuterol (PROVENTIL) (2.5 MG/3ML) 0.083% nebulizer solution Inhale 3 mLs (2.5 mg total) into the lungs every 4 (four) hours as needed for wheezing or shortness of breath.  75 mL  12  . alendronate (FOSAMAX) 70 MG tablet Take 70 mg by mouth once a week. Take with a full glass of water on an empty stomach.      Marland Kitchen aspirin EC 81 MG tablet  Take 81 mg by mouth at bedtime.       . clopidogrel (PLAVIX) 75 MG tablet Take 75 mg by mouth daily.      . diphenoxylate-atropine (LOMOTIL) 2.5-0.025 MG per tablet Take 1-2 tablets by mouth daily as needed for diarrhea or loose stools.      . Fluticasone-Salmeterol (ADVAIR) 100-50 MCG/DOSE AEPB Inhale 1 puff into the lungs every 12 (twelve) hours.      . gabapentin (NEURONTIN) 600 MG tablet Take 600 mg by mouth 3 (three) times daily.      Marland Kitchen gemfibrozil (LOPID) 600 MG tablet Take 600 mg by mouth 2 (two) times daily before a meal.      . glimepiride (AMARYL) 2 MG tablet Take 2 mg by mouth daily before breakfast.      . HYDROcodone-acetaminophen (NORCO) 10-325 MG per tablet Take 1 tablet by mouth 3 (three) times daily as needed. pain      . insulin glargine (LANTUS) 100 UNIT/ML injection Inject 0.3 mLs (30 Units total) into the skin daily.  10 mL  11  . insulin NPH Human (HUMULIN N,NOVOLIN N) 100 UNIT/ML injection Inject 43 Units into the skin daily before breakfast.      . insulin regular (NOVOLIN R,HUMULIN R) 100 units/mL injection Inject 5-10 Units into the skin 3 (three) times daily before meals. Based on sliding scale      . isosorbide mononitrate (IMDUR) 60 MG 24 hr tablet Take 60 mg by mouth every morning.       . lamoTRIgine (LAMICTAL) 25 MG tablet  Take 25 mg by mouth 2 (two) times daily.      Marland Kitchen. latanoprost (XALATAN) 0.005 % ophthalmic solution Place 1 drop into both eyes at bedtime.      Marland Kitchen. loratadine (CLARITIN) 10 MG tablet Take 10 mg by mouth daily.      . meclizine (ANTIVERT) 12.5 MG tablet Take 12.5 mg by mouth 3 (three) times daily as needed for dizziness.      . metFORMIN (GLUCOPHAGE) 1000 MG tablet 1,000 mg 2 (two) times daily.      . methocarbamol (ROBAXIN) 750 MG tablet Take 1 tablet (750 mg total) by mouth every 6 (six) hours as needed for muscle spasms.  30 tablet  0  . metoprolol (LOPRESSOR) 50 MG tablet Take 50 mg by mouth 2 (two) times daily.      . montelukast (SINGULAIR) 10  MG tablet Take 1 tablet (10 mg total) by mouth at bedtime.  30 tablet  1  . Multiple Vitamins-Minerals (MULTIVITAMINS THER. W/MINERALS) TABS tablet Take 1 tablet by mouth daily.      . nitroGLYCERIN (NITROSTAT) 0.4 MG SL tablet Place 0.4 mg under the tongue every 5 (five) minutes x 3 doses as needed. For chest pain. Do not exceed 3 tablets in 15 minutes      . omeprazole (PRILOSEC) 40 MG capsule Take 40 mg by mouth daily.      . potassium chloride (K-DUR) 10 MEQ tablet Take 10 mEq by mouth daily.      . predniSONE (DELTASONE) 50 MG tablet Down to 2.5mg  daily currently.      . ranitidine (ZANTAC) 300 MG tablet Take 1 tablet by mouth daily.      . simvastatin (ZOCOR) 20 MG tablet Take 20 mg by mouth daily.      . verapamil (VERELAN PM) 240 MG 24 hr capsule Take 240 mg by mouth at bedtime.       No current facility-administered medications for this visit.    Allergies as of 06/26/2014 - Review Complete 06/26/2014  Allergen Reaction Noted  . Food Anaphylaxis 05/28/2012  . Iodine Hives, Shortness Of Breath, and Swelling 05/24/2012  . Motrin [ibuprofen] Hives, Shortness Of Breath, and Swelling 05/24/2012  . Ambien [zolpidem tartrate] Other (See Comments) 05/24/2012  . Betadine [povidone iodine] Other (See Comments) 05/24/2012  . Carisoprodol Other (See Comments) 05/24/2012  . Celebrex [celecoxib] Other (See Comments) 05/24/2012  . Enalapril Hypertension 05/24/2012  . Hctz [hydrochlorothiazide] Other (See Comments) 05/24/2012  . Lasix [furosemide] Other (See Comments) 05/24/2012  . Maxidex [dexamethasone] Swelling and Other (See Comments) 05/24/2012  . Maxzide [hydrochlorothiazide w-triamterene] Other (See Comments) 07/04/2012  . Other  05/15/2014  . Theophyllines Other (See Comments) and Hypertension 05/24/2012  . Lopressor [metoprolol] Rash 05/11/2014   Past Surgical History  Procedure Laterality Date  . Knee surgery      BILATERAL  . Cholecystectomy    . Appendectomy    . Finger  surgery    . Foot surgery    . Coronary stent placement  1995    DR. ZACHARY  . Skin biopsy    . Eye surgery    . Esophagogastroduodenoscopy N/A 05/15/2014    Dr. Darrick PennaFields, mild gastritis, probable esophageal stricture s/p dilation  . Savory dilation N/A 05/15/2014    Procedure: SAVORY DILATION;  Surgeon: West BaliSandi L Fields, MD;  Location: [facility];  Service: Endoscopy;  Laterality: N/A;  Elease Hashimoto. Maloney dilation N/A 05/15/2014    Procedure: Elease HashimotoMALONEY DILATION;  Surgeon: West BaliSandi L Fields, MD;  Location: [facility];  Service: Endoscopy;  Laterality: N/A;    ROS:  General: Negative for anorexia, weight loss, fever, chills, fatigue, weakness. ENT: Negative for hoarseness, difficulty swallowing , nasal congestion. CV: Negative for chest pain, angina, palpitations, dyspnea on exertion. + peripheral edema.  Respiratory: Negative for dyspnea at rest, dyspnea on exertion, cough, sputum, wheezing.  GI: See history of present illness. GU:  Negative for dysuria, hematuria, urinary incontinence, urinary frequency, nocturnal urination.  Endo: Negative for unusual weight change.    Physical Examination:   BP 136/86  Pulse 100  Temp(Src) 97.4 F (36.3 C) (Oral)  Resp 18  Ht 5\' 7"  (1.702 m)  Wt 198 lb (89.812 kg)  BMI 31.00 kg/m2  General: Well-nourished, well-developed in no acute distress.  Eyes: No icterus. Mouth: Oropharyngeal mucosa dry, buccal cavity erythematous, no lesions erythema or exudate. Lungs: Clear to auscultation bilaterally.  Heart: Regular rate and rhythm, no murmurs rubs or gallops.  Abdomen: Bowel sounds are normal, nontender, nondistended, no hepatosplenomegaly or masses, no abdominal bruits or hernia , no rebound or guarding.  Patient declined regarding evaluation of perineum/genital region. Extremities: No lower extremity edema. No clubbing or deformities. Neuro: Alert and oriented x 4   Skin: Warm and dry, no jaundice.   Psych: Alert and cooperative, normal mood and  affect.  Labs:  Lab Results  Component Value Date   CREATININE 0.68 05/15/2014   BUN 12 05/15/2014   NA 149* 05/15/2014   K 4.1 05/15/2014   CL 110 05/15/2014   CO2 27 05/15/2014   Lab Results  Component Value Date   ALT 22 05/12/2014   AST 27 05/12/2014   ALKPHOS 50 05/12/2014   BILITOT 0.5 05/12/2014   Lab Results  Component Value Date   WBC 10.4 05/15/2014   HGB 11.5* 05/15/2014   HCT 35.3* 05/15/2014   MCV 99.2 05/15/2014   PLT 203 05/15/2014    Imaging Studies: No results found.

## 2014-06-26 NOTE — Assessment & Plan Note (Signed)
S/p esophageal dilation for stricture. Doing well with no recurrent symptoms.

## 2014-06-26 NOTE — Assessment & Plan Note (Signed)
Switch omeprazole to pantoprazole given Plavix use (previously not on her drug list). Increase to BID for 2 months, then once daily. OV in 08/2014 as planned with Dr. Darrick PennaFields.

## 2014-06-26 NOTE — Assessment & Plan Note (Signed)
She will call if does not clear up with current diflucan RX.

## 2014-06-26 NOTE — Patient Instructions (Signed)
1. Please stop omeprazole. It may reduce the benefits of Plavix. 2. Start pantoprazole for gastritis/GERD. Take one pill twice a day before breakfast and evening meal for the next 2 months. Then go back to once daily. 3. Stop zantac (ranitidine). 4. Try ketaconazole topically for yeast infection in your vaginal area.  5. Let me know if your yeast infection doesn't resolve.  6. We will review your recent labs from Dr. Earna CoderZachary and make further recommendations.  7. Office visit with Dr. Darrick PennaFields in 08/2014.

## 2014-07-02 ENCOUNTER — Emergency Department (HOSPITAL_COMMUNITY): Payer: Medicare Other

## 2014-07-02 ENCOUNTER — Emergency Department (HOSPITAL_COMMUNITY)
Admission: EM | Admit: 2014-07-02 | Discharge: 2014-07-02 | Disposition: A | Discharge status: Home / Self Care | Payer: Medicare Other | Attending: Emergency Medicine | Admitting: Emergency Medicine

## 2014-07-02 ENCOUNTER — Encounter (HOSPITAL_COMMUNITY): Payer: Self-pay | Admitting: Emergency Medicine

## 2014-07-02 DIAGNOSIS — Z87828 Personal history of other (healed) physical injury and trauma: Secondary | ICD-10-CM | POA: Insufficient documentation

## 2014-07-02 DIAGNOSIS — M1990 Unspecified osteoarthritis, unspecified site: Secondary | ICD-10-CM | POA: Insufficient documentation

## 2014-07-02 DIAGNOSIS — Y9289 Other specified places as the place of occurrence of the external cause: Secondary | ICD-10-CM | POA: Diagnosis not present

## 2014-07-02 DIAGNOSIS — J45909 Unspecified asthma, uncomplicated: Secondary | ICD-10-CM | POA: Insufficient documentation

## 2014-07-02 DIAGNOSIS — I2510 Atherosclerotic heart disease of native coronary artery without angina pectoris: Secondary | ICD-10-CM | POA: Diagnosis not present

## 2014-07-02 DIAGNOSIS — Z8742 Personal history of other diseases of the female genital tract: Secondary | ICD-10-CM | POA: Insufficient documentation

## 2014-07-02 DIAGNOSIS — H409 Unspecified glaucoma: Secondary | ICD-10-CM | POA: Diagnosis not present

## 2014-07-02 DIAGNOSIS — S8990XA Unspecified injury of unspecified lower leg, initial encounter: Secondary | ICD-10-CM | POA: Insufficient documentation

## 2014-07-02 DIAGNOSIS — Y9301 Activity, walking, marching and hiking: Secondary | ICD-10-CM | POA: Insufficient documentation

## 2014-07-02 DIAGNOSIS — Z7982 Long term (current) use of aspirin: Secondary | ICD-10-CM | POA: Diagnosis not present

## 2014-07-02 DIAGNOSIS — W010XXA Fall on same level from slipping, tripping and stumbling without subsequent striking against object, initial encounter: Secondary | ICD-10-CM | POA: Insufficient documentation

## 2014-07-02 DIAGNOSIS — IMO0002 Reserved for concepts with insufficient information to code with codable children: Secondary | ICD-10-CM | POA: Insufficient documentation

## 2014-07-02 DIAGNOSIS — Z794 Long term (current) use of insulin: Secondary | ICD-10-CM | POA: Insufficient documentation

## 2014-07-02 DIAGNOSIS — E119 Type 2 diabetes mellitus without complications: Secondary | ICD-10-CM | POA: Diagnosis not present

## 2014-07-02 DIAGNOSIS — G43909 Migraine, unspecified, not intractable, without status migrainosus: Secondary | ICD-10-CM | POA: Diagnosis not present

## 2014-07-02 DIAGNOSIS — S6990XA Unspecified injury of unspecified wrist, hand and finger(s), initial encounter: Secondary | ICD-10-CM | POA: Insufficient documentation

## 2014-07-02 DIAGNOSIS — R739 Hyperglycemia, unspecified: Secondary | ICD-10-CM

## 2014-07-02 DIAGNOSIS — N390 Urinary tract infection, site not specified: Secondary | ICD-10-CM | POA: Insufficient documentation

## 2014-07-02 DIAGNOSIS — S99919A Unspecified injury of unspecified ankle, initial encounter: Secondary | ICD-10-CM

## 2014-07-02 DIAGNOSIS — S59909A Unspecified injury of unspecified elbow, initial encounter: Secondary | ICD-10-CM | POA: Diagnosis not present

## 2014-07-02 DIAGNOSIS — S59919A Unspecified injury of unspecified forearm, initial encounter: Principal | ICD-10-CM

## 2014-07-02 DIAGNOSIS — Z79899 Other long term (current) drug therapy: Secondary | ICD-10-CM | POA: Insufficient documentation

## 2014-07-02 DIAGNOSIS — S99929A Unspecified injury of unspecified foot, initial encounter: Secondary | ICD-10-CM

## 2014-07-02 DIAGNOSIS — Z7902 Long term (current) use of antithrombotics/antiplatelets: Secondary | ICD-10-CM | POA: Diagnosis not present

## 2014-07-02 DIAGNOSIS — W19XXXA Unspecified fall, initial encounter: Secondary | ICD-10-CM

## 2014-07-02 DIAGNOSIS — Z9861 Coronary angioplasty status: Secondary | ICD-10-CM | POA: Insufficient documentation

## 2014-07-02 LAB — URINALYSIS, ROUTINE W REFLEX MICROSCOPIC
BILIRUBIN URINE: NEGATIVE
HGB URINE DIPSTICK: NEGATIVE
Ketones, ur: NEGATIVE mg/dL
Nitrite: NEGATIVE
PH: 5.5 (ref 5.0–8.0)
Protein, ur: NEGATIVE mg/dL
SPECIFIC GRAVITY, URINE: 1.015 (ref 1.005–1.030)
Urobilinogen, UA: 0.2 mg/dL (ref 0.0–1.0)

## 2014-07-02 LAB — COMPREHENSIVE METABOLIC PANEL
ALK PHOS: 55 U/L (ref 39–117)
ALT: 34 U/L (ref 0–35)
AST: 27 U/L (ref 0–37)
Albumin: 3.2 g/dL — ABNORMAL LOW (ref 3.5–5.2)
Anion gap: 15 (ref 5–15)
BILIRUBIN TOTAL: 0.3 mg/dL (ref 0.3–1.2)
BUN: 23 mg/dL (ref 6–23)
CALCIUM: 9 mg/dL (ref 8.4–10.5)
CO2: 26 mEq/L (ref 19–32)
Chloride: 99 mEq/L (ref 96–112)
Creatinine, Ser: 1.05 mg/dL (ref 0.50–1.10)
GFR calc non Af Amer: 53 mL/min — ABNORMAL LOW (ref >90)
GFR, EST AFRICAN AMERICAN: 62 mL/min — AB (ref >90)
Glucose, Bld: 426 mg/dL — ABNORMAL HIGH (ref 70–99)
Potassium: 4.8 mEq/L (ref 3.7–5.3)
Sodium: 140 mEq/L (ref 137–147)
Total Protein: 6.9 g/dL (ref 6.0–8.3)

## 2014-07-02 LAB — PROTIME-INR
INR: 2.63 — ABNORMAL HIGH (ref 0.00–1.49)
Prothrombin Time: 28.1 seconds — ABNORMAL HIGH (ref 11.6–15.2)

## 2014-07-02 LAB — CBC WITH DIFFERENTIAL/PLATELET
Basophils Absolute: 0 10*3/uL (ref 0.0–0.1)
Basophils Relative: 0 % (ref 0–1)
EOS PCT: 0 % (ref 0–5)
Eosinophils Absolute: 0 10*3/uL (ref 0.0–0.7)
HEMATOCRIT: 37.7 % (ref 36.0–46.0)
Hemoglobin: 12.6 g/dL (ref 12.0–15.0)
Lymphocytes Relative: 12 % (ref 12–46)
Lymphs Abs: 1 10*3/uL (ref 0.7–4.0)
MCH: 32.7 pg (ref 26.0–34.0)
MCHC: 33.4 g/dL (ref 30.0–36.0)
MCV: 97.9 fL (ref 78.0–100.0)
Monocytes Absolute: 0.3 10*3/uL (ref 0.1–1.0)
Monocytes Relative: 4 % (ref 3–12)
Neutro Abs: 7.1 10*3/uL (ref 1.7–7.7)
Neutrophils Relative %: 84 % — ABNORMAL HIGH (ref 43–77)
PLATELETS: 230 10*3/uL (ref 150–400)
RBC: 3.85 MIL/uL — AB (ref 3.87–5.11)
RDW: 14.1 % (ref 11.5–15.5)
WBC: 8.5 10*3/uL (ref 4.0–10.5)

## 2014-07-02 LAB — TROPONIN I: Troponin I: <0.3 ng/mL (ref <0.30)

## 2014-07-02 LAB — URINE MICROSCOPIC-ADD ON

## 2014-07-02 LAB — CBG MONITORING, ED: GLUCOSE-CAPILLARY: 351 mg/dL — AB (ref 70–99)

## 2014-07-02 MED ORDER — CEPHALEXIN 500 MG PO CAPS: 500.0000 mg | ORAL_CAPSULE | Freq: Four times a day (QID) | ORAL | Status: AC

## 2014-07-02 MED ORDER — ACETAMINOPHEN 325 MG PO TABS
650.0000 mg | ORAL_TABLET | Freq: Once | ORAL | Status: AC
  Administered 2014-07-02: 650 mg via ORAL
  Filled 2014-07-02: qty 2

## 2014-07-02 NOTE — Discharge Instructions (Signed)
Fall Prevention and Home Safety There is no evidence of serious traumatic injury. Followup with your doctor. Return to the ED if you develop new or worsening symptoms. Falls cause injuries and can affect all age groups. It is possible to use preventive measures to significantly decrease the likelihood of falls. There are many simple measures which can make your home safer and prevent falls. OUTDOORS  Repair cracks and edges of walkways and driveways.  Remove high doorway thresholds.  Trim shrubbery on the main path into your home.  Have good outside lighting.  Clear walkways of tools, rocks, debris, and clutter.  Check that handrails are not broken and are securely fastened. Both sides of steps should have handrails.  Have leaves, snow, and ice cleared regularly.  Use sand or salt on walkways during winter months.  In the garage, clean up grease or oil spills. BATHROOM  Install night lights.  Install grab bars by the toilet and in the tub and shower.  Use non-skid mats or decals in the tub or shower.  Place a plastic non-slip stool in the shower to sit on, if needed.  Keep floors dry and clean up all water on the floor immediately.  Remove soap buildup in the tub or shower on a regular basis.  Secure bath mats with non-slip, double-sided rug tape.  Remove throw rugs and tripping hazards from the floors. BEDROOMS  Install night lights.  Make sure a bedside light is easy to reach.  Do not use oversized bedding.  Keep a telephone by your bedside.  Have a firm chair with side arms to use for getting dressed.  Remove throw rugs and tripping hazards from the floor. KITCHEN  Keep handles on pots and pans turned toward the center of the stove. Use back burners when possible.  Clean up spills quickly and allow time for drying.  Avoid walking on wet floors.  Avoid hot utensils and knives.  Position shelves so they are not too high or low.  Place commonly used  objects within easy reach.  If necessary, use a sturdy step stool with a grab bar when reaching.  Keep electrical cables out of the way.  Do not use floor polish or wax that makes floors slippery. If you must use wax, use non-skid floor wax.  Remove throw rugs and tripping hazards from the floor. STAIRWAYS  Never leave objects on stairs.  Place handrails on both sides of stairways and use them. Fix any loose handrails. Make sure handrails on both sides of the stairways are as long as the stairs.  Check carpeting to make sure it is firmly attached along stairs. Make repairs to worn or loose carpet promptly.  Avoid placing throw rugs at the top or bottom of stairways, or properly secure the rug with carpet tape to prevent slippage. Get rid of throw rugs, if possible.  Have an electrician put in a light switch at the top and bottom of the stairs. OTHER FALL PREVENTION TIPS  Wear low-heel or rubber-soled shoes that are supportive and fit well. Wear closed toe shoes.  When using a stepladder, make sure it is fully opened and both spreaders are firmly locked. Do not climb a closed stepladder.  Add color or contrast paint or tape to grab bars and handrails in your home. Place contrasting color strips on first and last steps.  Learn and use mobility aids as needed. Install an electrical emergency response system.  Turn on lights to avoid dark areas. Replace light  bulbs that burn out immediately. Get light switches that glow.  Arrange furniture to create clear pathways. Keep furniture in the same place.  Firmly attach carpet with non-skid or double-sided tape.  Eliminate uneven floor surfaces.  Select a carpet pattern that does not visually hide the edge of steps.  Be aware of all pets. OTHER HOME SAFETY TIPS  Set the water temperature for 120 F (48.8 C).  Keep emergency numbers on or near the telephone.  Keep smoke detectors on every level of the home and near sleeping  areas. Document Released: 11/13/2002 Document Revised: 05/24/2012 Document Reviewed: 02/12/2012 [facility] Patient Information 2015 [employer], [place]. This information is not intended to replace advice given to you by your health care provider. Make sure you discuss any questions you have with your health care provider.  Hyperglycemia Hyperglycemia occurs when the glucose (sugar) in your blood is too high. Hyperglycemia can happen for many reasons, but it most often happens to people who do not know they have diabetes or are not managing their diabetes properly.  CAUSES  Whether you have diabetes or not, there are other causes of hyperglycemia. Hyperglycemia can occur when you have diabetes, but it can also occur in other situations that you might not be as aware of, such as: Diabetes  If you have diabetes and are having problems controlling your blood glucose, hyperglycemia could occur because of some of the following reasons:  Not following your meal plan.  Not taking your diabetes medications or not taking it properly.  Exercising less or doing less activity than you normally do.  Being sick. Pre-diabetes  This cannot be ignored. Before people develop Type 2 diabetes, they almost always have "pre-diabetes." This is when your blood glucose levels are higher than normal, but not yet high enough to be diagnosed as diabetes. Research has shown that some long-term damage to the body, especially the heart and circulatory system, may already be occurring during pre-diabetes. If you take action to manage your blood glucose when you have pre-diabetes, you may delay or prevent Type 2 diabetes from developing. Stress  If you have diabetes, you may be "diet" controlled or on oral medications or insulin to control your diabetes. However, you may find that your blood glucose is higher than usual in the hospital whether you have diabetes or not. This is often referred to as "stress hyperglycemia." Stress can  elevate your blood glucose. This happens because of hormones put out by the body during times of stress. If stress has been the cause of your high blood glucose, it can be followed regularly by your caregiver. That way he/she can make sure your hyperglycemia does not continue to get worse or progress to diabetes. Steroids  Steroids are medications that act on the infection fighting system (immune system) to block inflammation or infection. One side effect can be a rise in blood glucose. Most people can produce enough extra insulin to allow for this rise, but for those who cannot, steroids make blood glucose levels go even higher. It is not unusual for steroid treatments to "uncover" diabetes that is developing. It is not always possible to determine if the hyperglycemia will go away after the steroids are stopped. A special blood test called an A1c is sometimes done to determine if your blood glucose was elevated before the steroids were started. SYMPTOMS  Thirsty.  Frequent urination.  Dry mouth.  Blurred vision.  Tired or fatigue.  Weakness.  Sleepy.  Tingling in feet or leg.  DIAGNOSIS  Diagnosis is made by monitoring blood glucose in one or all of the following ways:  A1c test. This is a chemical found in your blood.  Fingerstick blood glucose monitoring.  Laboratory results. TREATMENT  First, knowing the cause of the hyperglycemia is important before the hyperglycemia can be treated. Treatment may include, but is not be limited to:  Education.  Change or adjustment in medications.  Change or adjustment in meal plan.  Treatment for an illness, infection, etc.  More frequent blood glucose monitoring.  Change in exercise plan.  Decreasing or stopping steroids.  Lifestyle changes. HOME CARE INSTRUCTIONS   Test your blood glucose as directed.  Exercise regularly. Your caregiver will give you instructions about exercise. Pre-diabetes or diabetes which comes on with  stress is helped by exercising.  Eat wholesome, balanced meals. Eat often and at regular, fixed times. Your caregiver or nutritionist will give you a meal plan to guide your sugar intake.  Being at an ideal weight is important. If needed, losing as little as 10 to 15 pounds may help improve blood glucose levels. SEEK MEDICAL CARE IF:   You have questions about medicine, activity, or diet.  You continue to have symptoms (problems such as increased thirst, urination, or weight gain). SEEK IMMEDIATE MEDICAL CARE IF:   You are vomiting or have diarrhea.  Your breath smells fruity.  You are breathing faster or slower.  You are very sleepy or incoherent.  You have numbness, tingling, or pain in your feet or hands.  You have chest pain.  Your symptoms get worse even though you have been following your caregiver's orders.  If you have any other questions or concerns. Document Released: 05/19/2001 Document Revised: 02/15/2012 Document Reviewed: 03/21/2012 [facility] Patient Information 2015 [place], [place]. This information is not intended to replace advice given to you by your health care provider. Make sure you discuss any questions you have with your health care provider.

## 2014-07-02 NOTE — ED Provider Notes (Signed)
CSN: [id number]     Arrival date & time 07/02/14  1357 History   First MD Initiated Contact with Patient 07/02/14 1456     Chief Complaint  Patient presents with  . Fall     (Consider location/radiation/quality/duration/timing/severity/associated sxs/prior Treatment) HPI Comments: Patient complains of bilateral elbow pain left hand pain after falling yesterday. She normally walks with a cane and she stumbled landing on her knees, elbows and left hand. Denies hitting head or losing consciousness. She endorses pain to dorsum of her left hand with exit predates the fall. She's had some bruising to the dorsum of the hand with a hard nodule for the past 3 days and she wonders if she hit it when she was sleeping. She denies any fevers, chills, nausea vomiting. No chest pain or shortness of breath. She is on Coumadin for history of pulmonary embolism. Denies any syncope, dizziness, lightheadedness, focal weakness, numbness or tingling.  The history is provided by the patient.    Past Medical History  Diagnosis Date  . PMR (polymyalgia rheumatica)   . Giant cell arteritis   . Acute coronary syndrome   . Diabetes mellitus   . Glaucoma   . Asthma   . Osteoarthritis   . Migraine   . Macular pucker   . C. difficile colitis   . Pyelonephritis   . Bronchitis   . Dysphagia   . Coronary artery disease    Past Surgical History  Procedure Laterality Date  . Knee surgery      BILATERAL  . Cholecystectomy    . Appendectomy    . Finger surgery    . Foot surgery    . Coronary stent placement  1995    DR. ZACHARY  . Skin biopsy    . Eye surgery    . Esophagogastroduodenoscopy N/A 05/15/2014    Dr. Darrick Penna, mild gastritis, probable esophageal stricture s/p dilation  . Savory dilation N/A 05/15/2014    Procedure: SAVORY DILATION;  Surgeon: West Bali, MD;  Location: [facility];  Service: Endoscopy;  Laterality: N/A;  Elease Hashimoto dilation N/A 05/15/2014    Procedure: Elease Hashimoto DILATION;  Surgeon:  West Bali, MD;  Location: [facility];  Service: Endoscopy;  Laterality: N/A;   Family History  Problem Relation Age of Onset  . Colonic polyp Maternal Aunt 80  . Heart disease Mother   . Emphysema Father    History  Substance Use Topics  . Smoking status: Never Smoker   . Smokeless tobacco: Never Used  . Alcohol Use: No   OB History   Grav Para Term Preterm Abortions TAB SAB Ect Mult Living   3 2 2  1  1   2      Review of Systems  Constitutional: Negative for fever, activity change and appetite change.  Respiratory: Negative for cough, chest tightness and shortness of breath.   Gastrointestinal: Negative for nausea, vomiting and abdominal pain.  Genitourinary: Negative for dysuria and hematuria.  Musculoskeletal: Positive for arthralgias and myalgias. Negative for neck pain and neck stiffness.  Neurological: Negative for dizziness, weakness, light-headedness and headaches.  A complete 10 system review of systems was obtained and all systems are negative except as noted in the HPI and PMH.      Allergies  Food; Iodine; Motrin; Ambien; Betadine; Carisoprodol; Celebrex; Enalapril; Hctz; Lasix; Maxidex; Maxzide; Other; Theophyllines; and Lopressor  Home Medications   Prior to Admission medications   Medication Sig Start Date End Date Taking? Authorizing Provider  albuterol (PROVENTIL HFA;VENTOLIN HFA) 108 (90 BASE) MCG/ACT inhaler Inhale 2 puffs into the lungs every 6 (six) hours as needed. For shortness of breath/wheezing   Yes Historical Provider, MD  albuterol (PROVENTIL) (2.5 MG/3ML) 0.083% nebulizer solution Inhale 3 mLs (2.5 mg total) into the lungs every 4 (four) hours as needed for wheezing or shortness of breath. 05/15/14  Yes Jeralyn Bennett, MD  alendronate (FOSAMAX) 70 MG tablet Take 70 mg by mouth once a week. Take with a full glass of water on an empty stomach.   Yes Historical Provider, MD  aspirin EC 81 MG tablet Take 81 mg by mouth at bedtime.    Yes  Historical Provider, MD  clopidogrel (PLAVIX) 75 MG tablet Take 75 mg by mouth daily.   Yes Historical Provider, MD  diphenoxylate-atropine (LOMOTIL) 2.5-0.025 MG per tablet Take 1-2 tablets by mouth daily as needed for diarrhea or loose stools.   Yes Historical Provider, MD  Fluticasone-Salmeterol (ADVAIR) 100-50 MCG/DOSE AEPB Inhale 1 puff into the lungs every 12 (twelve) hours.   Yes Historical Provider, MD  gabapentin (NEURONTIN) 600 MG tablet Take 600 mg by mouth 3 (three) times daily.   Yes Historical Provider, MD  gemfibrozil (LOPID) 600 MG tablet Take 600 mg by mouth 2 (two) times daily before a meal.   Yes Historical Provider, MD  glimepiride (AMARYL) 2 MG tablet Take 2 mg by mouth daily before breakfast.   Yes Historical Provider, MD  HYDROcodone-acetaminophen (NORCO) 10-325 MG per tablet Take 1 tablet by mouth 3 (three) times daily as needed. pain 04/11/14  Yes Historical Provider, MD  insulin glargine (LANTUS) 100 UNIT/ML injection Inject 0.3 mLs (30 Units total) into the skin daily. 05/15/14  Yes Jeralyn Bennett, MD  insulin NPH Human (HUMULIN N,NOVOLIN N) 100 UNIT/ML injection Inject 43 Units into the skin daily before breakfast.   Yes Historical Provider, MD  insulin regular (NOVOLIN R,HUMULIN R) 100 units/mL injection Inject 5-10 Units into the skin 3 (three) times daily before meals. Based on sliding scale   Yes Historical Provider, MD  isosorbide mononitrate (IMDUR) 60 MG 24 hr tablet Take 60 mg by mouth every morning.    Yes Historical Provider, MD  ketoconazole (NIZORAL) 2 % cream Apply 1 application topically daily. Externally in labia area. 06/26/14  Yes Tiffany Kocher, PA-C  lamoTRIgine (LAMICTAL) 25 MG tablet Take 25 mg by mouth 2 (two) times daily.   Yes Historical Provider, MD  latanoprost (XALATAN) 0.005 % ophthalmic solution Place 1 drop into both eyes at bedtime.   Yes Historical Provider, MD  loratadine (CLARITIN) 10 MG tablet Take 10 mg by mouth daily.   Yes Historical  Provider, MD  meclizine (ANTIVERT) 12.5 MG tablet Take 12.5 mg by mouth 3 (three) times daily as needed for dizziness.   Yes Historical Provider, MD  metFORMIN (GLUCOPHAGE) 1000 MG tablet 1,000 mg 2 (two) times daily. 05/25/14  Yes Historical Provider, MD  methocarbamol (ROBAXIN) 750 MG tablet Take 1 tablet (750 mg total) by mouth every 6 (six) hours as needed for muscle spasms. 05/15/14  Yes Jeralyn Bennett, MD  metoprolol (LOPRESSOR) 50 MG tablet Take 50 mg by mouth 2 (two) times daily.   Yes Historical Provider, MD  montelukast (SINGULAIR) 10 MG tablet Take 1 tablet (10 mg total) by mouth at bedtime. 05/15/14  Yes Jeralyn Bennett, MD  Multiple Vitamins-Minerals (MULTIVITAMINS THER. W/MINERALS) TABS tablet Take 1 tablet by mouth daily.   Yes Historical Provider, MD  pantoprazole (PROTONIX) 40 MG tablet  Take 1 tablet (40 mg total) by mouth 2 (two) times daily before a meal. Take twice daily for 2 months, then once daily. 06/26/14  Yes Tiffany Kocher, PA-C  potassium chloride (K-DUR) 10 MEQ tablet Take 10 mEq by mouth daily.   Yes Historical Provider, MD  ranitidine (ZANTAC) 300 MG tablet Take 1 tablet by mouth daily. 04/28/14  Yes Historical Provider, MD  simvastatin (ZOCOR) 20 MG tablet Take 20 mg by mouth daily.   Yes Historical Provider, MD  verapamil (VERELAN PM) 240 MG 24 hr capsule Take 240 mg by mouth at bedtime.   Yes Historical Provider, MD  cephALEXin (KEFLEX) 500 MG capsule Take 1 capsule (500 mg total) by mouth 4 (four) times daily. 07/02/14   Glynn Octave, MD  nitroGLYCERIN (NITROSTAT) 0.4 MG SL tablet Place 0.4 mg under the tongue every 5 (five) minutes x 3 doses as needed. For chest pain. Do not exceed 3 tablets in 15 minutes    Historical Provider, MD   BP 109/64  Pulse 82  Temp(Src) 98.4 F (36.9 C) (Oral)  Resp 18  Ht 5\' 7"  (1.702 m)  Wt 193 lb (87.544 kg)  BMI 30.22 kg/m2  SpO2 97% Physical Exam  Nursing note and vitals reviewed. Constitutional: She is oriented to person,  place, and time. She appears well-developed and well-nourished. No distress.  HENT:  Head: Normocephalic and atraumatic.  Mouth/Throat: Oropharynx is clear and moist. No oropharyngeal exudate.  Eyes: Conjunctivae and EOM are normal. Pupils are equal, round, and reactive to light.  Neck: Normal range of motion. Neck supple.  Paraspinal C spine tenderness  Cardiovascular: Normal rate, regular rhythm, normal heart sounds and intact distal pulses.   No murmur heard. Pulmonary/Chest: Effort normal and breath sounds normal. No respiratory distress.  Abdominal: Soft. There is no tenderness. There is no rebound and no guarding.  Musculoskeletal: Normal range of motion. She exhibits tenderness. She exhibits no edema.  There is discoloration to dorsum of left hand with 0.5 cm firm nodule. No fluctuance. Intact radial pulse, intact cardinal hand movements There is a blood blister to the distal aspect of the fourth digit capillary refill is intact.  Skin tears bilateral elbows without bony tenderness  Neurological: She is alert and oriented to person, place, and time. No cranial nerve deficit. She exhibits normal muscle tone. Coordination normal.  No ataxia on finger to nose bilaterally. No pronator drift. 5/5 strength throughout. CN 2-12 intact. Negative Romberg. Equal grip strength. Sensation intact. Gait is normal.   Skin: Skin is warm.  Psychiatric: She has a normal mood and affect. Her behavior is normal.    ED Course  Procedures (including critical care time) Labs Review Labs Reviewed  CBC WITH DIFFERENTIAL - Abnormal; Notable for the following:    RBC 3.85 (*)    Neutrophils Relative % 84 (*)    All other components within normal limits  COMPREHENSIVE METABOLIC PANEL - Abnormal; Notable for the following:    Glucose, Bld 426 (*)    Albumin 3.2 (*)    GFR calc non Af Amer 53 (*)    GFR calc Af Amer 62 (*)    All other components within normal limits  PROTIME-INR - Abnormal; Notable for  the following:    Prothrombin Time 28.1 (*)    INR 2.63 (*)    All other components within normal limits  URINALYSIS, ROUTINE W REFLEX MICROSCOPIC - Abnormal; Notable for the following:    Glucose, UA >1000 (*)  Leukocytes, UA SMALL (*)    All other components within normal limits  URINE MICROSCOPIC-ADD ON - Abnormal; Notable for the following:    Squamous Epithelial / LPF FEW (*)    Bacteria, UA FEW (*)    All other components within normal limits  CBG MONITORING, ED - Abnormal; Notable for the following:    Glucose-Capillary 351 (*)    All other components within normal limits  TROPONIN I    Imaging Review Dg Elbow Complete Left  07/02/2014   CLINICAL DATA:  Larey Seat with bruising to both elbows.  EXAM: LEFT ELBOW - COMPLETE 3+ VIEW  COMPARISON:  None.  FINDINGS: No evidence of acute fracture or dislocation. Well-preserved joint spaces. No intrinsic osseous abnormalities. No posterior fat pad to confirm joint effusion or hemarthrosis.  IMPRESSION: Normal examination.   Electronically Signed   By: Hulan Saas M.D.   On: 07/02/2014 16:03   Dg Elbow Complete Right  07/02/2014   CLINICAL DATA:  Status post fall.  Bruising about the right elbow.  EXAM: RIGHT ELBOW - COMPLETE 3+ VIEW  COMPARISON:  None.  FINDINGS: Imaged bones, joints and soft tissues appear normal.  IMPRESSION: Negative exam.   Electronically Signed   By: Drusilla Kanner M.D.   On: 07/02/2014 16:03   Ct Head Wo Contrast  07/02/2014   CLINICAL DATA:  Status post fall.  Loss of balance.  EXAM: CT HEAD WITHOUT CONTRAST  CT CERVICAL SPINE WITHOUT CONTRAST  TECHNIQUE: Multidetector CT imaging of the head and cervical spine was performed following the standard protocol without intravenous contrast. Multiplanar CT image reconstructions of the cervical spine were also generated.  COMPARISON:  None.  FINDINGS: CT HEAD FINDINGS  There is no evidence of acute intracranial abnormality including infarct, hemorrhage, mass lesion, mass  effect, midline shift or abnormal extra-axial fluid collection. There is no hydrocephalus or pneumocephalus. The calvarium is intact. Imaged paranasal sinuses and mastoid air cells are clear.  CT CERVICAL SPINE FINDINGS  There is no cervical spine fracture. Reversal of the normal cervical lordosis is noted. Loss of disc space height appears worst at C5-6 and C6-7. Prominent disc bulge C3-4 is noted. Lung apices are clear.  IMPRESSION: No acute finding head or cervical spine.  Multilevel cervical spondylosis.   Electronically Signed   By: Drusilla Kanner M.D.   On: 07/02/2014 16:18   Ct Cervical Spine Wo Contrast  07/02/2014   CLINICAL DATA:  Status post fall.  Loss of balance.  EXAM: CT HEAD WITHOUT CONTRAST  CT CERVICAL SPINE WITHOUT CONTRAST  TECHNIQUE: Multidetector CT imaging of the head and cervical spine was performed following the standard protocol without intravenous contrast. Multiplanar CT image reconstructions of the cervical spine were also generated.  COMPARISON:  None.  FINDINGS: CT HEAD FINDINGS  There is no evidence of acute intracranial abnormality including infarct, hemorrhage, mass lesion, mass effect, midline shift or abnormal extra-axial fluid collection. There is no hydrocephalus or pneumocephalus. The calvarium is intact. Imaged paranasal sinuses and mastoid air cells are clear.  CT CERVICAL SPINE FINDINGS  There is no cervical spine fracture. Reversal of the normal cervical lordosis is noted. Loss of disc space height appears worst at C5-6 and C6-7. Prominent disc bulge C3-4 is noted. Lung apices are clear.  IMPRESSION: No acute finding head or cervical spine.  Multilevel cervical spondylosis.   Electronically Signed   By: Drusilla Kanner M.D.   On: 07/02/2014 16:18   Dg Hand Complete Left  07/02/2014  CLINICAL DATA:  Status fall.  Left hand pain.  EXAM: LEFT HAND - COMPLETE 3+ VIEW  COMPARISON:  Plain films left hand 05/15/2014.  FINDINGS: No acute bony or joint abnormality is  identified. Postoperative change of fusion of the DIP joint of the long finger is noted. Multifocal osteoarthritis again seen.  IMPRESSION: No acute abnormality.  Stable compared to prior exam.   Electronically Signed   By: Drusilla Kannerhomas  Dalessio M.D.   On: 07/02/2014 16:02     EKG Interpretation   Date/Time:  Monday July 02 2014 15:33:58 EDT Ventricular Rate:  93 PR Interval:  159 QRS Duration: 86 QT Interval:  374 QTC Calculation: 465 R Axis:   23 Text Interpretation:  Sinus rhythm Probable left atrial enlargement No  significant change was found Confirmed by Manus GunningANCOUR  MD, Layal Javid [phone]([id number]) on  07/02/2014 3:38:03 PM      MDM   Final diagnoses:  Fall, initial encounter  Hyperglycemia  Urinary tract infection without hematuria, site unspecified   Fall with pain to left hand and bilateral elbows. On Coumadin. No loss of consciousness. No syncope, chest pain or shortness of breath. tetanus up-to-date  Coumadin therapeutic at 2.6 X-rays negative for acute pathology. Patient able to ambulate without assistance. CT head and C spine negative. Suspect ganglion cyst on dorsal L hand. Follow up with hand surgery. Hyperglycemia without DKA. Anion gap 15. Urinalysis with E[place] blood cells, we'll sent culture  Patient appears stable for discharge. She denies any dizziness or lightheadedness. Her neurological exam is nonfocal.   Glynn OctaveStephen Cullan Launer, MD 07/03/14 0127

## 2014-07-02 NOTE — ED Notes (Signed)
CBG-351, pt states she drank some regular Coke and ate potato chips about 30 minutes ago.

## 2014-07-02 NOTE — ED Notes (Signed)
Pt states she lost her balance last night. Pain to left hand (which was hurting previously, before fall), skin tear to left and right elbow (which were there before fall).  Pt states she has been losing her balance a lot lately and has been falling.

## 2014-07-06 ENCOUNTER — Encounter: Payer: Self-pay | Admitting: Gastroenterology

## 2014-07-06 NOTE — Progress Notes (Signed)
Reviewed records: Colonoscopy 08/2010, Dr. Samuella CotaPandya Hemorrhoids, colon polyps (path unavailable)

## 2014-08-07 ENCOUNTER — Encounter: Payer: Self-pay | Admitting: Gastroenterology

## 2014-10-08 ENCOUNTER — Encounter: Payer: Self-pay | Admitting: Gastroenterology

## 2016-01-18 NOTE — Progress Notes (Signed)
REVIEWED-NO ADDITIONAL RECOMMENDATIONS. 

## 2020-12-07 DEATH — deceased
